# Patient Record
Sex: Male | Born: 2000 | Race: White | Hispanic: No | Marital: Single | Smoking: Never smoker
Health system: Southern US, Community
[De-identification: ages and names within clinical notes are randomized; demographics above are authoritative.]

## PROBLEM LIST (undated history)

## (undated) HISTORY — PX: TYMPANOSTOMY TUBE PLACEMENT: SHX32

## (undated) HISTORY — PX: LASER ABLATION: SHX1947

---
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
## 2020-09-26 ENCOUNTER — Ambulatory Visit: Payer: 59 | Admitting: Medical

## 2020-09-26 ENCOUNTER — Encounter: Payer: Self-pay | Admitting: Medical

## 2020-09-26 ENCOUNTER — Other Ambulatory Visit: Payer: Self-pay

## 2020-09-26 VITALS — BP 120/80 | HR 84 | Ht 76.0 in | Wt 296.6 lb

## 2020-09-26 DIAGNOSIS — L03031 Cellulitis of right toe: Secondary | ICD-10-CM

## 2020-09-26 MED ORDER — AMOXICILLIN-POT CLAVULANATE 875-125 MG PO TABS: 1.0000 | ORAL_TABLET | Freq: Two times a day (BID) | ORAL | 0 refills | Status: DC

## 2020-09-26 NOTE — Progress Notes (Signed)
Subjective:  Kenneth Kennedy is a 20 y.o. male who presents for Chief Complaint  Patient presents with  . Ingrown Toenail    Right foot middle toe   . New Patient (Initial Visit)     Here as a new patient.   His mother sees me for care as well.    In usual state of health until over past week has had swelling redness and pain of right 3rd toe.  No injury.  He cuts toenails straight across.  He started seeing swelling, getting redness pain and some pus drainage.  His mother took a clean nail care probe and stuck into area that was red and angry and pus came out.  He does note now improving area of redness but still has symptoms.  No other aggravating or relieving factors. No other complaint.  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  Sophomore at [employer], history and education major.  No other aggravating or relieving factors.    No other c/o.  The following portions of the patient's history were reviewed and updated as appropriate: allergies, current medications, past family history, past medical history, past social history, past surgical history and problem list.  ROS Otherwise as in subjective above  Objective: BP 120/80   Pulse 84   Ht 6\' 4"  (1.93 m)   Wt 296 lb 9.6 oz (134.5 kg)   SpO2 94%   BMI 36.10 kg/m   General appearance: alert, no distress, well developed, well nourished Pedal pulses normal bilat right 3rd toe medial nailbed with erythema swelling, but no drainage, no obvious pus pocket.  Nail doesn't seem to be ingrowing.   Otherwise toes and toenails unremarkable   Assessment: Encounter Diagnosis  Name Primary?  . Paronychia of third toe, right Yes     Plan: Discussed findings, symptoms.   He notes quite a bit of improvement since few days ago.  Begin medicaiton below, continue soap water bath soaks, good hygiene.  If not resolved within the next week or if worse then recheck   otherwise f/u soon for physical  Kenneth Kennedy was seen today for  ingrown toenail and new patient (initial visit).  Diagnoses and all orders for this visit:  Paronychia of third toe, right  Other orders -     amoxicillin-clavulanate (AUGMENTIN) 875-125 MG tablet; Take 1 tablet by mouth 2 (two) times daily.    Follow up: prn, in next few months for a physical

## 2020-09-26 NOTE — Patient Instructions (Signed)
Paronychia Paronychia is an infection of the skin. It happens near a fingernail or toenail. It may cause pain and swelling around the nail. In some cases, a fluid-filled bump (abscess) can form near or under the nail. Usually, this condition is not serious, and it clears up with treatment. Follow these instructions at home: Wound care  Keep the affected area clean.  Soak the fingers or toes in warm water as told by your doctor. You may be told to do this for 20 minutes, 2-3 times a day.  Keep the area dry when you are not soaking it.  Do not try to drain a fluid-filled bump on your own.  Follow instructions from your doctor about how to take care of the affected area. Make sure you: ? Wash your hands with soap and water before you change your bandage (dressing). If you cannot use soap and water, use hand sanitizer. ? Change your bandage as told by your doctor.  If you had a fluid-filled bump and your doctor drained it, check the area every day for signs of infection. Check for: ? Redness, swelling, or pain. ? Fluid or blood. ? Warmth. ? Pus or a bad smell. Medicines  Take over-the-counter and prescription medicines only as told by your doctor.  If you were prescribed an antibiotic medicine, take it as told by your doctor. Do not stop taking it even if you start to feel better.   General instructions  Avoid touching any chemicals.  Do not pick at the affected area. Prevention  To prevent this condition from happening again: ? Wear rubber gloves when putting your hands in water for washing dishes or other tasks. ? Wear gloves if your hands might touch cleaners or chemicals. ? Avoid injuring your nails or fingertips. ? Do not bite your nails or tear hangnails. ? Do not cut your nails very short. ? Do not cut the skin at the base and sides of the nail (cuticles). ? Use clean nail clippers or scissors when trimming nails. Contact a doctor if:  You feel worse.  You do not get  better.  You have more fluid, blood, or pus coming from the affected area.  Your finger or knuckle is swollen or is hard to move. Get help right away if you have:  A fever or chills.  Redness spreading from the affected area.  Pain in a joint or muscle. Summary  Paronychia is an infection of the skin. It happens near a fingernail or toenail.  This condition may cause pain and swelling around the nail.  Soak the fingers or toes in warm water as told by your doctor.  Usually, this condition is not serious, and it clears up with treatment. This information is not intended to replace advice given to you by your health care provider. Make sure you discuss any questions you have with your health care provider. Document Revised: 06/04/2020 Document Reviewed: 06/04/2020 Elsevier Patient Education  2021 Elsevier Inc.  

## 2020-11-19 ENCOUNTER — Ambulatory Visit: Payer: 59 | Admitting: Family Medicine

## 2020-11-19 ENCOUNTER — Encounter: Payer: Self-pay | Admitting: Family Medicine

## 2020-11-19 ENCOUNTER — Other Ambulatory Visit: Payer: Self-pay

## 2020-11-19 VITALS — BP 120/70 | HR 92 | Temp 98.3°F | Wt 301.0 lb

## 2020-11-19 DIAGNOSIS — L259 Unspecified contact dermatitis, unspecified cause: Secondary | ICD-10-CM | POA: Diagnosis not present

## 2020-11-19 MED ORDER — TRIAMCINOLONE ACETONIDE 0.1 % EX CREA: 1.0000 "application " | TOPICAL_CREAM | Freq: Two times a day (BID) | CUTANEOUS | 0 refills | Status: DC

## 2020-11-19 NOTE — Patient Instructions (Signed)
Use the steroid cream I prescribed twice daily for the next 7 to 10 days.  If the area is getting worse, please let me know.  You can use an ice pack to help reduce the itching. If needed, you can take a Benadryl at bedtime for itching as well.    Contact Dermatitis Dermatitis is redness, soreness, and swelling (inflammation) of the skin. Contact dermatitis is a reaction to something that touches the skin. There are two types of contact dermatitis:  Irritant contact dermatitis. This happens when something bothers (irritates) your skin, like soap.  Allergic contact dermatitis. This is caused when you are exposed to something that you are allergic to, such as poison ivy. What are the causes?  Common causes of irritant contact dermatitis include: ? Makeup. ? Soaps. ? Detergents. ? Bleaches. ? Acids. ? Metals, such as nickel.  Common causes of allergic contact dermatitis include: ? Plants. ? Chemicals. ? Jewelry. ? Latex. ? Medicines. ? Preservatives in products, such as clothing. What increases the risk?  Having a job that exposes you to things that bother your skin.  Having asthma or eczema. What are the signs or symptoms? Symptoms may happen anywhere the irritant has touched your skin. Symptoms include:  Dry or flaky skin.  Redness.  Cracks.  Itching.  Pain or a burning feeling.  Blisters.  Blood or clear fluid draining from skin cracks. With allergic contact dermatitis, swelling may occur. This may happen in places such as the eyelids, mouth, or genitals.   How is this treated?  This condition is treated by checking for the cause of the reaction and protecting your skin. Treatment may also include: ? Steroid creams, ointments, or medicines. ? Antibiotic medicines or other ointments, if you have a skin infection. ? Lotion or medicines to help with itching. ? A bandage (dressing). Follow these instructions at home: Skin care  Moisturize your skin as  needed.  Put cool cloths on your skin.  Put a baking soda paste on your skin. Stir water into baking soda until it looks like a paste.  Do not scratch your skin.  Avoid having things rub up against your skin.  Avoid the use of soaps, perfumes, and dyes. Medicines  Take or apply over-the-counter and prescription medicines only as told by your doctor.  If you were prescribed an antibiotic medicine, take or apply it as told by your doctor. Do not stop using it even if your condition starts to get better. Bathing  Take a bath with: ? Epsom salts. ? Baking soda. ? Colloidal oatmeal.  Bathe less often.  Bathe in warm water. Avoid using hot water. Bandage care  If you were given a bandage, change it as told by your health care provider.  Wash your hands with soap and water before and after you change your bandage. If soap and water are not available, use hand sanitizer. General instructions  Avoid the things that caused your reaction. If you do not know what caused it, keep a journal. Write down: ? What you eat. ? What skin products you use. ? What you drink. ? What you wear in the area that has symptoms. This includes jewelry.  Check the affected areas every day for signs of infection. Check for: ? More redness, swelling, or pain. ? More fluid or blood. ? Warmth. ? Pus or a bad smell.  Keep all follow-up visits as told by your doctor. This is important. Contact a doctor if:  You do not  get better with treatment.  Your condition gets worse.  You have signs of infection, such as: ? More swelling. ? Tenderness. ? More redness. ? Soreness. ? Warmth.  You have a fever.  You have new symptoms. Get help right away if:  You have a very bad headache.  You have neck pain.  Your neck is stiff.  You throw up (vomit).  You feel very sleepy.  You see red streaks coming from the area.  Your bone or joint near the area hurts after the skin has healed.  The area  turns darker.  You have trouble breathing. Summary  Dermatitis is redness, soreness, and swelling of the skin.  Symptoms may occur where the irritant has touched you.  Treatment may include medicines and skin care.  If you do not know what caused your reaction, keep a journal.  Contact a doctor if your condition gets worse or you have signs of infection. This information is not intended to replace advice given to you by your health care provider. Make sure you discuss any questions you have with your health care provider. Document Revised: 11/29/2018 Document Reviewed: 02/22/2018 Elsevier Patient Education  2021 [employer].

## 2020-11-19 NOTE — Progress Notes (Signed)
   Subjective:    Patient ID: Kenneth Kennedy, male    DOB: 08-25-2000, 20 y.o.   MRN: [id number]  HPI Chief Complaint  Patient presents with  . Rash    Rash on both legs, noticed it Saturday or Sunday after lifting a door at home   Complains of a 3 to 4-day history of a pruritic rash on his lower legs, worse on the left.  Rash started a couple days after he and his father working in the basement and lifting a door.  He was wearing shorts and thought he may have come in contact with fiberglass or some sort of irritant. He has been using a cream that his mother gave him.  He does not know what his using.  No other complaints or concerns. Denies fever, chills, nausea, vomiting.   Review of Systems Pertinent positives and negatives in the history of present illness.     Objective:   Physical Exam BP 120/70   Pulse 92   Temp 98.3 F (36.8 C)   Wt (!) 301 lb (136.5 kg)   BMI 36.64 kg/m   Alert and oriented in no acute distress. Red, raised, pruritic rash to the left anterolateral lower leg above the sock line.  He also has a small area of the same on the right anterior, medial Lower leg just above the sock line.  No surrounding erythema, drainage or sign of infection.  Normal-appearing left foot.      Assessment & Plan:  Contact dermatitis, unspecified contact dermatitis type, unspecified trigger - Plan: triamcinolone (KENALOG) 0.1 %  He will use the topical steroid and avoid scratching the area.  Discussed using ice pack/cool compresses to help control itching.  You may also use Benadryl at bedtime if needed.  Follow-up if the area is worsening or not significantly improved in the next 7 to 10 days.  He is aware that he will need to stop the topical steroid in 1 week.

## 2021-04-02 ENCOUNTER — Encounter: Payer: 59 | Admitting: Medical

## 2021-06-12 ENCOUNTER — Other Ambulatory Visit: Payer: Self-pay

## 2021-06-12 ENCOUNTER — Telehealth: Payer: 59 | Admitting: Medical

## 2021-06-12 ENCOUNTER — Encounter: Payer: Self-pay | Admitting: Medical

## 2021-06-12 ENCOUNTER — Other Ambulatory Visit (INDEPENDENT_AMBULATORY_CARE_PROVIDER_SITE_OTHER): Payer: 59

## 2021-06-12 VITALS — HR 93 | Wt 285.0 lb

## 2021-06-12 DIAGNOSIS — J029 Acute pharyngitis, unspecified: Secondary | ICD-10-CM | POA: Diagnosis not present

## 2021-06-12 LAB — POCT RAPID STREP A (OFFICE): Rapid Strep A Screen: NEGATIVE

## 2021-06-12 NOTE — Progress Notes (Signed)
  Subjective:     Patient ID: Kenneth Kennedy, male   DOB: Aug 13, 2001, 20 y.o.   MRN: [id number]  This visit type was conducted due to national recommendations for restrictions regarding the COVID-19 Pandemic (e.g. social distancing) in an effort to limit this patient's exposure and mitigate transmission in our community.  Due to their co-morbid illnesses, this patient is at least at moderate risk for complications without adequate follow up.  This format is felt to be most appropriate for this patient at this time.    Documentation for virtual audio and video telecommunications through [place] encounter:  The patient was located at home. The provider was located in the office. The patient did consent to this visit and is aware of possible charges through their insurance for this visit.  The other persons participating in this telemedicine service were none. Time spent on call was 20 minutes and in review of previous records 20 minutes total.  This virtual service is not related to other E/M service within previous 7 days.   HPI Chief Complaint  Patient presents with   Sore Throat    Sore throat started on Wednesday night Thursday morning. Headache started yesterday. No other symptoms   Virtual consult for illness.  Started a day and a half ago with headache, sore throat, subjective fever, possible white spots on the tonsils and swollen tonsils.  Occasional cough but not much cough.  No head congestion just feels a little congestion in the throat.  No chest symptoms.  No body aches but has been cold.  But it has been cold outside anyhow.  No vomiting, no diarrhea.  Has had some nausea.  No sick contacts.  Otherwise normal state of health.  No other aggravating or relieving factors. No other complaint.  No past medical history on file. No current outpatient medications on file prior to visit.   No current facility-administered medications on file prior to visit.    Review of  Systems As in subjective    Objective:   Physical Exam Due to coronavirus pandemic stay at home measures, patient visit was virtual and they were not examined in person.   Pulse 93   Wt 285 lb (129.3 kg)   BMI 34.69 kg/m   Well-developed, well-nourished, no acute distress Mildly ill-appearing No obvious wheezing or labored breathing     Assessment:     Encounter Diagnosis  Name Primary?   Sore throat Yes       Plan:     We discussed symptoms and concerns.  Likely viral sore throat versus strep.  We discussed supportive care rest, hydration, salt water gargles, warm fluids, use of ibuprofen to help with throat pain fever or body aches.    If positive for strep we will treat with antibiotic.  We discussed the difference between respiratory viral and strep infection.  Over the weekend is much worse or other new symptoms then recheck a call back or get reevaluated  Kenneth Kennedy was seen today for sore throat.  Diagnoses and all orders for this visit:  Sore throat  Follow-up in our back parking lot for strep testing this morning

## 2021-08-12 ENCOUNTER — Other Ambulatory Visit: Payer: Self-pay

## 2021-08-12 ENCOUNTER — Encounter: Payer: Self-pay | Admitting: Medical

## 2021-08-12 ENCOUNTER — Ambulatory Visit: Payer: 59 | Admitting: Medical

## 2021-08-12 VITALS — BP 112/82 | HR 90 | Ht 76.0 in | Wt 277.0 lb

## 2021-08-12 DIAGNOSIS — Z13220 Encounter for screening for lipoid disorders: Secondary | ICD-10-CM | POA: Diagnosis not present

## 2021-08-12 DIAGNOSIS — Z131 Encounter for screening for diabetes mellitus: Secondary | ICD-10-CM

## 2021-08-12 DIAGNOSIS — Z0000 Encounter for general adult medical examination without abnormal findings: Secondary | ICD-10-CM | POA: Diagnosis not present

## 2021-08-12 DIAGNOSIS — Z2821 Immunization not carried out because of patient refusal: Secondary | ICD-10-CM | POA: Insufficient documentation

## 2021-08-12 DIAGNOSIS — Z6833 Body mass index (BMI) 33.0-33.9, adult: Secondary | ICD-10-CM | POA: Insufficient documentation

## 2021-08-12 DIAGNOSIS — Z7185 Encounter for immunization safety counseling: Secondary | ICD-10-CM | POA: Insufficient documentation

## 2021-08-12 DIAGNOSIS — Z1329 Encounter for screening for other suspected endocrine disorder: Secondary | ICD-10-CM

## 2021-08-12 NOTE — Progress Notes (Signed)
Subjective:   HPI  Kenneth Kennedy is a 20 y.o. male who presents for Chief Complaint  Patient presents with   Annual Exam    No concerns, currently in [place] for [employer], declines flu shot and covid booster today    Patient Care Team: Kynsleigh Westendorf, Leward Quan as PCP - General (Family Medicine) Sees dentist No recent eye doctor   Concerns: none   Reviewed their medical, surgical, family, social, medication, and allergy history and updated chart as appropriate.  History reviewed. No pertinent past medical history.  Past Surgical History:  Procedure Laterality Date   TYMPANOSTOMY TUBE PLACEMENT      Family History  Problem Relation Age of Onset   Depression Mother    Anxiety disorder Mother    Cancer Father        melanoma   Hydrocephalus Brother    Diabetes Other    Stroke Other    Heart disease Other     No current outpatient medications on file.  Allergies  Allergen Reactions   Cleocin [Clindamycin] Hives and Nausea And Vomiting     Review of Systems Constitutional: -fever, -chills, -sweats, -unexpected weight change, -decreased appetite, -fatigue Allergy: -sneezing, -itching, -congestion Dermatology: -changing moles, --rash, -lumps ENT: -runny nose, -ear pain, -sore throat, -hoarseness, -sinus pain, -teeth pain, - ringing in ears, -hearing loss, -nosebleeds Cardiology: -chest pain, -palpitations, -swelling, -difficulty breathing when lying flat, -waking up short of breath Respiratory: -cough, -shortness of breath, -difficulty breathing with exercise or exertion, -wheezing, -coughing up blood Gastroenterology: -abdominal pain, -nausea, -vomiting, -diarrhea, -constipation, -blood in stool, -changes in bowel movement, -difficulty swallowing or eating Hematology: -bleeding, -bruising  Musculoskeletal: -joint aches, -muscle aches, -joint swelling, -back pain, -neck pain, -cramping, -changes in gait Ophthalmology: denies vision changes, eye redness, itching,  discharge Urology: -burning with urination, -difficulty urinating, -blood in urine, -urinary frequency, -urgency, -incontinence Neurology: -headache, -weakness, -tingling, -numbness, -memory loss, -falls, -dizziness Psychology: -depressed mood, -agitation, -sleep problems Male GU: no testicular mass, pain, no lymph nodes swollen, no swelling, no rash.  Depression screen [facility] 2/9 08/12/2021 06/12/2021 09/26/2020  Decreased Interest 0 0 0  Down, Depressed, Hopeless 0 0 0  PHQ - 2 Score 0 0 0        Objective:  BP 112/82 (BP Location: Right Arm, Patient Position: Sitting)    Pulse 90    Ht $R'6\' 4"'HH$  (1.93 m)    Wt 277 lb (125.6 kg)    SpO2 97%    BMI 33.72 kg/m   Wt Readings from Last 3 Encounters:  08/12/21 277 lb (125.6 kg)  06/12/21 285 lb (129.3 kg)  11/19/20 (!) 301 lb (136.5 kg) (>99 %, Z= 3.08)*   * Growth percentiles are based on CDC (Boys, 2-20 Years) data.   BP Readings from Last 3 Encounters:  08/12/21 112/82  11/19/20 120/70  09/26/20 120/80    General appearance: alert, no distress, WD/WN, Caucasian male Skin: scattered macules, no worrisome lesions HEENT: normocephalic, conjunctiva/corneas normal, sclerae anicteric, PERRLA, EOMi Neck: supple, no lymphadenopathy, no thyromegaly, no masses, normal ROM, no bruits Chest: non tender, normal shape and expansion Heart: RRR, normal S1, S2, no murmurs Lungs: CTA bilaterally, no wheezes, rhonchi, or rales Abdomen: +bs, soft, non tender, non distended, no masses, no hepatomegaly, no splenomegaly, no bruits Back: non tender, normal ROM, no scoliosis Musculoskeletal: upper extremities non tender, no obvious deformity, normal ROM throughout, lower extremities non tender, no obvious deformity, normal ROM throughout Extremities: no edema, no cyanosis, no clubbing Pulses:  2+ symmetric, upper and lower extremities, normal cap refill Neurological: alert, oriented x 3, CN2-12 intact, strength normal upper extremities and lower extremities,  sensation normal throughout, DTRs 2+ throughout, no cerebellar signs, gait normal Psychiatric: normal affect, behavior normal, pleasant  GU: normal male external genitalia,circumcised, nontender, no masses, no hernia, no lymphadenopathy Rectal: deferred   Assessment and Plan :   Encounter Diagnoses  Name Primary?   Encounter for health maintenance examination in adult Yes   Vaccine counseling    Influenza vaccination declined    Screening for lipid disorders    Screening for diabetes mellitus    Screening for thyroid disorder    BMI 33.0-33.9,adult     This visit was a preventative care visit, also known as wellness visit or routine physical.   Topics typically include healthy lifestyle, diet, exercise, preventative care, vaccinations, sick and well care, proper use of emergency dept and after hours care, as well as other concerns.     Recommendations: Continue to return yearly for your annual wellness and preventative care visits.  This gives [place] a chance to discuss healthy lifestyle, exercise, vaccinations, review your chart record, and perform screenings where appropriate.  I recommend you see your dentist yearly for routine dental care including hygiene visits twice yearly.   Vaccination recommendations were reviewed Immunization History  Administered Date(s) Administered   DTaP 03/20/2001, 05/24/2001, 08/09/2001, 04/16/2002, 02/03/2005   HPV 9-valent 02/05/2015, 03/03/2016   Hepatitis A 02/03/2005, 08/06/2005   Hepatitis B 07/15/2001, 02/17/2001, 11/08/2001   HiB (PRP-OMP) 03/20/2001, 05/24/2001, 08/09/2001, 04/16/2002   IPV 03/20/2001, 05/24/2001, 11/08/2001, 02/03/2005   MMR 01/15/2002, 02/03/2005   Meningococcal B, OMV 02/15/2017, 05/09/2017   Meningococcal Conjugate 06/06/2013, 02/15/2017   PFIZER(Purple Top)SARS-COV-2 Vaccination 11/03/2019, 11/28/2019   Pneumococcal Conjugate-13 03/20/2001, 05/24/2001, 08/09/2001, 01/15/2002   Tdap 06/21/2011, 07/17/2018    Varicella 01/15/2002, 04/15/2006    He declines flu shot   Screening for cancer: Colon cancer screening: Age 15  Testicular cancer screening You should do a monthly self testicular exam if you are between 20-65 years old  We discussed PSA, prostate exam, and prostate cancer screening risks/benefits.   Age 63  Skin cancer screening: Check your skin regularly for new changes, growing lesions, or other lesions of concern Come in for evaluation if you have skin lesions of concern.  Lung cancer screening: If you have a greater than 20 pack year history of tobacco use, then you may qualify for lung cancer screening with a chest CT scan.   Please call your insurance company to inquire about coverage for this test.  We currently don't have screenings for other cancers besides breast, cervical, colon, and lung cancers.  If you have a strong family history of cancer or have other cancer screening concerns, please let me know.     Heart health: Get at least 150 minutes of aerobic exercise weekly Limit alcohol It is important to maintain a healthy blood pressure and healthy cholesterol numbers  Heart disease screening: Screening for heart disease includes screening for blood pressure, fasting lipids, glucose/diabetes screening, BMI height to weight ratio, reviewed of smoking status, physical activity, and diet.    Goals include blood pressure 120/80 or less, maintaining a healthy lipid/cholesterol profile, preventing diabetes or keeping diabetes numbers under good control, not smoking or using tobacco products, exercising most days per week or at least 150 minutes per week of exercise, and eating healthy variety of fruits and vegetables, healthy oils, and avoiding unhealthy food choices like fried food, fast  food, high sugar and high cholesterol foods.       Medical care options: I recommend you continue to seek care here first for routine care.  We try really hard to have available  appointments Monday through Friday daytime hours for sick visits, acute visits, and physicals.  Urgent care should be used for after hours and weekends for significant issues that cannot wait till the next day.  The emergency department should be used for significant potentially life-threatening emergencies.  The emergency department is expensive, can often have long wait times for less significant concerns, so try to utilize primary care, urgent care, or telemedicine when possible to avoid unnecessary trips to the emergency department.  Virtual visits and telemedicine have been introduced since the pandemic started in 2020, and can be convenient ways to receive medical care.  We offer virtual appointments as well to assist you in a variety of options to seek medical care.    Separate significant issues discussed: Continue efforts to lose weight, congratulated him on his efforts this year   Melvyn was seen today for annual exam.  Diagnoses and all orders for this visit:  Encounter for health maintenance examination in adult -     Comprehensive metabolic panel -     CBC -     Lipid panel -     TSH -     Hemoglobin A1c  Vaccine counseling  Influenza vaccination declined  Screening for lipid disorders -     Lipid panel  Screening for diabetes mellitus -     Hemoglobin A1c  Screening for thyroid disorder -     TSH  BMI 33.0-33.9,adult   Follow-up pending labs, yearly for physical

## 2021-08-13 ENCOUNTER — Other Ambulatory Visit: Payer: Self-pay | Admitting: Medical

## 2021-08-13 DIAGNOSIS — R748 Abnormal levels of other serum enzymes: Secondary | ICD-10-CM

## 2021-08-13 LAB — LIPID PANEL
Chol/HDL Ratio: 4.8 ratio (ref 0.0–5.0)
Cholesterol, Total: 171 mg/dL (ref 100–199)
HDL: 36 mg/dL — ABNORMAL LOW (ref >39)
LDL Chol Calc (NIH): 119 mg/dL — ABNORMAL HIGH (ref 0–99)
Triglycerides: 84 mg/dL (ref 0–149)
VLDL Cholesterol Cal: 16 mg/dL (ref 5–40)

## 2021-08-13 LAB — CBC
Hematocrit: 48.2 % (ref 37.5–51.0)
Hemoglobin: 17 g/dL (ref 13.0–17.7)
MCH: 30.4 pg (ref 26.6–33.0)
MCHC: 35.3 g/dL (ref 31.5–35.7)
MCV: 86 fL (ref 79–97)
Platelets: 326 10*3/uL (ref 150–450)
RBC: 5.6 x10E6/uL (ref 4.14–5.80)
RDW: 12.7 % (ref 11.6–15.4)
WBC: 6.5 10*3/uL (ref 3.4–10.8)

## 2021-08-13 LAB — COMPREHENSIVE METABOLIC PANEL
ALT: 37 IU/L (ref 0–44)
AST: 19 IU/L (ref 0–40)
Albumin/Globulin Ratio: 1.8 (ref 1.2–2.2)
Albumin: 4.9 g/dL (ref 4.1–5.2)
Alkaline Phosphatase: 174 IU/L — ABNORMAL HIGH (ref 51–125)
BUN/Creatinine Ratio: 9 (ref 9–20)
BUN: 10 mg/dL (ref 6–20)
Bilirubin Total: 0.8 mg/dL (ref 0.0–1.2)
CO2: 23 mmol/L (ref 20–29)
Calcium: 9.8 mg/dL (ref 8.7–10.2)
Chloride: 102 mmol/L (ref 96–106)
Creatinine, Ser: 1.14 mg/dL (ref 0.76–1.27)
Globulin, Total: 2.7 g/dL (ref 1.5–4.5)
Glucose: 97 mg/dL (ref 70–99)
Potassium: 4.9 mmol/L (ref 3.5–5.2)
Sodium: 139 mmol/L (ref 134–144)
Total Protein: 7.6 g/dL (ref 6.0–8.5)
eGFR: 94 mL/min/{1.73_m2} (ref >59)

## 2021-08-13 LAB — HEMOGLOBIN A1C
Est. average glucose Bld gHb Est-mCnc: 105 mg/dL
Hgb A1c MFr Bld: 5.3 % (ref 4.8–5.6)

## 2021-08-13 LAB — TSH: TSH: 0.55 u[IU]/mL (ref 0.450–4.500)

## 2021-08-16 ENCOUNTER — Emergency Department (HOSPITAL_COMMUNITY)
Admission: EM | Admit: 2021-08-16 | Discharge: 2021-08-16 | Disposition: A | Discharge status: Home / Self Care | Payer: 59 | Attending: Emergency Medicine | Admitting: Emergency Medicine

## 2021-08-16 ENCOUNTER — Encounter (HOSPITAL_COMMUNITY): Payer: Self-pay | Admitting: Emergency Medicine

## 2021-08-16 ENCOUNTER — Other Ambulatory Visit: Payer: Self-pay

## 2021-08-16 DIAGNOSIS — U071 COVID-19: Secondary | ICD-10-CM | POA: Insufficient documentation

## 2021-08-16 DIAGNOSIS — R000 Tachycardia, unspecified: Secondary | ICD-10-CM | POA: Insufficient documentation

## 2021-08-16 DIAGNOSIS — J069 Acute upper respiratory infection, unspecified: Secondary | ICD-10-CM | POA: Diagnosis not present

## 2021-08-16 DIAGNOSIS — R059 Cough, unspecified: Secondary | ICD-10-CM | POA: Diagnosis present

## 2021-08-16 LAB — RESP PANEL BY RT-PCR (FLU A&B, COVID) ARPGX2
Influenza A by PCR: NEGATIVE
Influenza B by PCR: NEGATIVE
SARS Coronavirus 2 by RT PCR: POSITIVE — AB

## 2021-08-16 NOTE — ED Triage Notes (Addendum)
Complains of flu like symptoms and abdominal pain. Cough, fever, HA x2 days.

## 2021-08-16 NOTE — ED Provider Notes (Signed)
[facility]-EMERGENCY DEPT Provider Note   CSN: [id number] Arrival date & time: 08/16/21  1509     History No chief complaint on file.   Kenneth Kennedy is a 20 y.o. male with no significant past medical history who presents with 2 days of cough, fever, headache, sore throat, as well as diarrhea.  Patient denies any chest pain, shortness of breath, nausea, vomiting.  Patient denies any hematochezia.  Patient reports that he had a fever of 101.5 earlier today, which has improved to 99.5 after oral Motrin x1.  Patient reports that he has had no recent sick contacts, did not have a flu vaccine, or COVID booster this year.  Patient denies history of asthma, COPD, tobacco use.  Patient reports that his headache is frontally located, worse with cough, gradual in onset, not associated with blurry vision, weakness, numbness.  HPI     History reviewed. No pertinent past medical history.  Patient Active Problem List   Diagnosis Date Noted   Encounter for health maintenance examination in adult 08/12/2021   Vaccine counseling 08/12/2021   Influenza vaccination declined 08/12/2021   BMI 33.0-33.9,adult 08/12/2021    Past Surgical History:  Procedure Laterality Date   TYMPANOSTOMY TUBE PLACEMENT         Family History  Problem Relation Age of Onset   Depression Mother    Anxiety disorder Mother    Cancer Father        melanoma   Hydrocephalus Brother    Diabetes Other    Stroke Other    Heart disease Other     Social History   Tobacco Use   Smoking status: Never   Smokeless tobacco: Never  Vaping Use   Vaping Use: Never used  Substance Use Topics   Alcohol use: No   Drug use: No    Home Medications Prior to Admission medications   Not on File    Allergies    Cleocin [clindamycin]  Review of Systems   Review of Systems  Constitutional:  Positive for chills and fever.  HENT:  Positive for sore throat.   Respiratory:  Positive for cough.    Gastrointestinal:  Positive for diarrhea.  All other systems reviewed and are negative.  Physical Exam Updated Vital Signs BP (!) 144/88 (BP Location: Right Arm)    Pulse (!) 110    Temp 99.5 F (37.5 C) (Oral)    Resp 18    SpO2 100%   Physical Exam Vitals and nursing note reviewed.  Constitutional:      General: He is not in acute distress.    Appearance: Normal appearance.  HENT:     Head: Normocephalic and atraumatic.     Nose: Congestion present.     Mouth/Throat:     Comments: He does have erythema, and 1+ tonsils bilaterally.  He does not have any evidence of peritonsillar abscess, uvula is midline.  There is no evidence of tonsillar exudate.  Airway is patent, intact swallow reflex. Eyes:     General:        Right eye: No discharge.        Left eye: No discharge.  Neck:     Comments: Patient does have some palpable cervical adenopathy on the right. Cardiovascular:     Rate and Rhythm: Regular rhythm. Tachycardia present.     Heart sounds: No murmur heard.   No friction rub. No gallop.  Pulmonary:     Effort: Pulmonary effort is normal.  Breath sounds: Normal breath sounds.     Comments: Clear breath sounds bilaterally, no respiratory distress, good excursion bilaterally. Abdominal:     General: Bowel sounds are normal.     Palpations: Abdomen is soft.  Musculoskeletal:     Cervical back: Neck supple. No rigidity.  Lymphadenopathy:     Cervical: Cervical adenopathy present.  Skin:    General: Skin is warm and dry.     Capillary Refill: Capillary refill takes less than 2 seconds.  Neurological:     Mental Status: He is alert and oriented to person, place, and time.  Psychiatric:        Mood and Affect: Mood normal.        Behavior: Behavior normal.    ED Results / Procedures / Treatments   Labs (all labs ordered are listed, but only abnormal results are displayed) Labs Reviewed  RESP PANEL BY RT-PCR (FLU A&B, COVID) [id number]     EKG None  Radiology No results found.  Procedures Procedures   Medications Ordered in ED Medications - No data to display  ED Course  I have reviewed the triage vital signs and the nursing notes.  Pertinent labs & imaging results that were available during my care of the patient were reviewed by me and considered in my medical decision making (see chart for details).    MDM Rules/Calculators/A&P                         Overall well-appearing male with signs and symptoms of a simple upper respiratory infection.  Patient does have some erythema in his posterior oropharynx, however his tonsils are 1+ bilaterally without exudate, uvula midline, no evidence of peritonsillar abscess.  Patient's airway is patent, he is having no respiratory distress, no stridor.  Patient does have a borderline fever, as well as tachycardia on arrival.  I do believe that this is secondary to his upper respiratory infection syndrome.  High clinical suspicion for flu.  As patient has no chest pain, clear breath sounds bilaterally, and no medical conditions that would make him high risk for flu versus COVID, we discussed I recommend respiratory virus panel, and discharge.  No red flag symptoms for headache including neck rigidity, focal neurologic deficit, confusion, or mental status change. Encourage supportive care with over-the-counter cold and flu medications, fluids, Tylenol, ibuprofen for body aches, headaches, fever.  Return precautions given, patient discharged in stable condition. Final Clinical Impression(s) / ED Diagnoses Final diagnoses:  Viral upper respiratory tract infection    Rx / DC Orders ED Discharge Orders     None        [place] 08/16/21 1531    Gloris Manchester, MD 08/16/21 2325

## 2021-08-16 NOTE — Discharge Instructions (Signed)
As we discussed you have signs and symptoms that are consistent with an upper respiratory infection of viral origin.  Based on my evaluation of your throat I have minimal concern that you have strep throat.  You can check the results of your COVID, flu swab on your patient portal in around 2 hours.  If you do have COVID current CDC guidelines recommend 5 days of strict quarantine with 5 days of strict masking thereafter.  If you have the flu we recommend that you stay out of work or school until you are 24 hours fever free without the use of ibuprofen or Tylenol.  I recommend that you use symptomatic over-the-counter relief including Mucinex or other decongestants for stuffy nose, sneezing, you can use Robitussin to help with your sore throat, or lozenges.  I recommend that you drink plenty of fluids, rest.  Please continue to take ibuprofen, Tylenol for fever, body aches, headache.  If your symptoms significantly worsen despite use of the medications above, and especially if you develop chest pain, shortness of breath, begin to have blood in your stool or intractable nausea and vomiting I recommend that you return for further evaluation.  It was a pleasure taking care of you today, I hope that you have a Merry Christmas.

## 2021-08-18 ENCOUNTER — Other Ambulatory Visit: Payer: Self-pay

## 2021-08-18 ENCOUNTER — Encounter: Payer: Self-pay | Admitting: Medical

## 2021-08-18 ENCOUNTER — Telehealth (INDEPENDENT_AMBULATORY_CARE_PROVIDER_SITE_OTHER): Payer: 59 | Admitting: Medical

## 2021-08-18 VITALS — HR 96 | Temp 99.2°F | Wt 270.0 lb

## 2021-08-18 DIAGNOSIS — U071 COVID-19: Secondary | ICD-10-CM | POA: Diagnosis not present

## 2021-08-18 DIAGNOSIS — J039 Acute tonsillitis, unspecified: Secondary | ICD-10-CM

## 2021-08-18 MED ORDER — AMOXICILLIN 500 MG PO CAPS: 500.0000 mg | ORAL_CAPSULE | Freq: Three times a day (TID) | ORAL | 0 refills | Status: AC

## 2021-08-18 NOTE — Progress Notes (Signed)
Subjective:     Patient ID: Kenneth Kennedy, male   DOB: 01-25-2001, 20 y.o.   MRN: [id number]  This visit type was conducted due to national recommendations for restrictions regarding the COVID-19 Pandemic (e.g. social distancing) in an effort to limit this patient's exposure and mitigate transmission in our community.  Due to their co-morbid illnesses, this patient is at least at moderate risk for complications without adequate follow up.  This format is felt to be most appropriate for this patient at this time.    Documentation for virtual audio and video telecommunications through [facility] encounter:  The patient was located at home. The provider was located in the office. The patient did consent to this visit and is aware of possible charges through their insurance for this visit.  The other persons participating in this telemedicine service were none. Time spent on call was 20 minutes and in review of previous records 20 minutes total.  This virtual service is not related to other E/M service within previous 7 days.   HPI Chief Complaint  Patient presents with   Covid Positive    DX on 08/16/21 at [facility]- currently only S/S is sore throat with mild congestion   Virtual consult for illness.  On Christmas day he felt terrible and went to the emergency department at [facility].  Was evaluated and tested positive for COVID.  He found this result out the next morning  Symptoms started on December 25.  His main symptoms is swollen tonsils red tonsils and really bad sore throat he notes congestion, some cough, runny nose, headaches, chills, sweats.  No nausea vomiting or diarrhea.  No shortness of breath or wheezing.  No sick contacts.  Using tylenol and alternating mucinex, sudafed, dayquil  No other aggravating or relieving factors. No other complaint.   Review of Systems As in subjective    Objective:   Physical Exam Due to coronavirus pandemic stay at home  measures, patient visit was virtual and they were not examined in person.   Pulse 96    Temp 99.2 F (37.3 C) (Oral)    Wt 270 lb (122.5 kg)    BMI 32.87 kg/m   Gen: wd, wn, nad, somewhat ill appearing Answers questions appropriately No labored breathing On video it appears that he has bilateral swollen erythematous tonsils without exudate    Assessment:     Encounter Diagnoses  Name Primary?   COVID Yes   Tonsillitis        Plan:     We discussed symptoms and concerns.  Discussed supportive care, rest, hydration, salt water gargles.  Advised ibuprofen over-the-counter 3 tablets twice daily for the next few days for throat pain and aches.  Advised to use DayQuil or Sudafed or Mucinex but not simultaneously all of them at the same time.  Begin medication below for likely concomitant strep tonsillitis which we have seen with COVID on numerous occasions.  If not significant improvements in the throat in the next 48 hours then call back.  Otherwise discussed typical timeframe to see symptoms improved from COVID infection.  We discussed that fatigue and cough can linger weeks after the initial infection.  We discussed period of quarantine as well.  [name] was seen today for covid positive.  Diagnoses and all orders for this visit:  COVID  Tonsillitis  Other orders -     amoxicillin (AMOXIL) 500 MG capsule; Take 1 capsule (500 mg total) by mouth 3 (three) times daily  for 10 days.  F/u prn

## 2021-08-23 DIAGNOSIS — Z8659 Personal history of other mental and behavioral disorders: Secondary | ICD-10-CM

## 2021-08-23 HISTORY — DX: Personal history of other mental and behavioral disorders: Z86.59

## 2022-01-06 ENCOUNTER — Ambulatory Visit: Payer: 59 | Admitting: Medical

## 2022-01-06 VITALS — BP 104/68 | HR 90 | Wt 284.6 lb

## 2022-01-06 DIAGNOSIS — F321 Major depressive disorder, single episode, moderate: Secondary | ICD-10-CM | POA: Insufficient documentation

## 2022-01-06 MED ORDER — FLUOXETINE HCL 20 MG PO TABS: 20.0000 mg | ORAL_TABLET | Freq: Every day | ORAL | 1 refills | Status: DC

## 2022-01-06 NOTE — Assessment & Plan Note (Addendum)
Discussed concerns.  Discussed possible coping mechanisms.  Begin counseling next week as planned.   Begin trial of medication, Prozac.  Discussed risks and benefits of medications.  Discussed local mental health and crisis resources.  Follow up in 2-3 weeks.   ?

## 2022-01-06 NOTE — Progress Notes (Signed)
Subjective: ? Kenneth Kennedy is a 21 y.o. male who presents for ?Chief Complaint  ?Patient presents with  ? aniexty and depression  ?  Anxiety and depression getting worse.been going on for years. Never prescribed meds for this. Has an appointment therapist on Monday through insurance company. Pq-9 and GAD abnormal  ?   ?Depression ?       This is a recurrent problem.  The current episode started more than 1 year ago.   The onset quality is gradual.   The problem occurs daily.  The most recent episode lasted 4 years.  ?The problem is unchanged.  Associated symptoms include decreased concentration, decreased interest and sad.  Associated symptoms include no suicidal ideas.     The symptoms are aggravated by nothing.  Past treatments include nothing.  Previous treatment provided no relief relief.  Risk factors include family history of mental illness.  ? ?He notes problems with anxiety and depression for years, but recently worse.   Lately other people have commented on his mood and his school work is suffering. ? 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  Lives at home with parents.    Working part time.  Part time coaching, part time camp counselor.    ? ?He denies any mentor relationship. ? ?Relationship with parents is ok.   Has an older brother.  They only talk when coaching. ? ?No alcohol, no drugs.    ? ?On father's side there is a strong history of depression.  No known history of bipolar or schizophrenia or other mental health issues.   ? ?Having some bad grades currently. ? ?No current relationship ? ?Current coping is staying busy and working. ? ?Plans to pursue aviation ? ?No other aggravating or relieving factors.   ? ?No other c/o. ? ?No past medical history on file. ?No current outpatient medications on file prior to visit.  ? ?No current facility-administered medications on file prior to visit.  ? ? ? ?The following portions of the patient's history were reviewed and  updated as appropriate: allergies, current medications, past family history, past medical history, past social history, past surgical history and problem list. ? ?ROS ?Otherwise as in subjective above ? ? ? ?Objective: ?BP 104/68   Pulse 90   Wt 284 lb 9.6 oz (129.1 kg)   BMI 34.64 kg/m?  ? ?General appearance: alert, no distress, well developed, well nourished ?Psych: pleasant, good eye contact, answers questions appropriately ? ? ?Assessment: ?Encounter Diagnosis  ?Name Primary?  ? Depression, major, single episode, moderate (HCC) Yes  ? ? ? ?Plan: ? ?Problem List Items Addressed This Visit   ? ? Depression, major, single episode, moderate (HCC) - Primary  ?  Discussed concerns.  Discussed possible coping mechanisms.  Begin counseling next week as planned.   Begin trial of medication, Prozac.  Discussed risks and benefits of medications.  Discussed local mental health and crisis resources.  Follow up in 2-3 weeks.   ? ?  ?  ? Relevant Medications  ? FLUoxetine (PROZAC) 20 MG tablet  ? ? ?Follow up: 3-4 weeks ?

## 2022-02-03 ENCOUNTER — Ambulatory Visit: Payer: 59 | Admitting: Medical

## 2022-02-03 VITALS — BP 118/70 | HR 84 | Wt 274.0 lb

## 2022-02-03 DIAGNOSIS — F321 Major depressive disorder, single episode, moderate: Secondary | ICD-10-CM | POA: Diagnosis not present

## 2022-02-03 DIAGNOSIS — R748 Abnormal levels of other serum enzymes: Secondary | ICD-10-CM | POA: Insufficient documentation

## 2022-02-03 MED ORDER — FLUOXETINE HCL 20 MG PO TABS: 30.0000 mg | ORAL_TABLET | Freq: Every day | ORAL | 2 refills | Status: DC

## 2022-02-03 NOTE — Assessment & Plan Note (Signed)
Continue counseling, increase fluoxetine Prozac to 30mg  daily.  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  F/u 3-4 weeks

## 2022-02-03 NOTE — Assessment & Plan Note (Signed)
likely vit D deficinecy or age related . plan to recheck this lab late summer.   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

## 2022-02-03 NOTE — Progress Notes (Signed)
Subjective:  Kenneth Kennedy is a 21 y.o. male who presents for Chief Complaint  Patient presents with   4 week folllow-up on depression     Some better since last visit     Here for recheck on depression.  Last visit began trial of Prozac and started counseling.  Has had 2 sessions with counseling since last visit.   Hasn't seen major improvement but some improvements since starting Prozac.   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  Here to recheck on alkaline phosphatase elevation in low HDL cholesterol from his physical a few months ago in December.  He notes problems with anxiety and depression for years, but recently worse.    Prior to 12/2021 visit, other people had commented on his mood and his school work is suffering.  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  Lives at home with parents.    Working part time.  Part time coaching, part time camp counselor.   On summer break currently  Relationship with parents is ok.   Has an older brother.  They only talk when coaching.  No alcohol, no drugs.     On father's side there is a strong history of depression.  No known history of bipolar or schizophrenia or other mental health issues.    No current relationship  Plans to pursue aviation  No other aggravating or relieving factors.    No other c/o.  No past medical history on file. Current Outpatient Medications on File Prior to Visit  Medication Sig Dispense Refill   Celecoxib (CELEBREX PO) Take by mouth.     No current facility-administered medications on file prior to visit.     The following portions of the patient's history were reviewed and updated as appropriate: allergies, current medications, past family history, past medical history, past social history, past surgical history and problem list.  ROS Otherwise as in subjective above    Objective: BP 118/70   Pulse 84   Wt 274 lb  (124.3 kg)   BMI 33.35 kg/m   General appearance: alert, no distress, well developed, well nourished Psych: pleasant, good eye contact, answers questions appropriately   Assessment: Encounter Diagnoses  Name Primary?   Depression, major, single episode, moderate (HCC) Yes   Alkaline phosphatase elevation      Plan:  Problem List Items Addressed This Visit     Depression, major, single episode, moderate (HCC) - Primary    Continue counseling, increase fluoxetine Prozac to 30mg  daily.  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  F/u 3-4 weeks      Relevant Medications   FLUoxetine (PROZAC) 20 MG tablet   Alkaline phosphatase elevation    likely vit D deficinecy or age related . plan to recheck this lab late summer.   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

## 2022-03-18 ENCOUNTER — Ambulatory Visit: Payer: 59 | Admitting: Medical

## 2022-03-18 ENCOUNTER — Encounter: Payer: Self-pay | Admitting: Medical

## 2022-03-18 VITALS — BP 130/80 | HR 82 | Wt 275.0 lb

## 2022-03-18 DIAGNOSIS — R5383 Other fatigue: Secondary | ICD-10-CM

## 2022-03-18 DIAGNOSIS — R4589 Other symptoms and signs involving emotional state: Secondary | ICD-10-CM

## 2022-03-18 DIAGNOSIS — R6882 Decreased libido: Secondary | ICD-10-CM | POA: Diagnosis not present

## 2022-03-18 DIAGNOSIS — R748 Abnormal levels of other serum enzymes: Secondary | ICD-10-CM

## 2022-03-18 NOTE — Progress Notes (Signed)
Subjective:  Kenneth Kennedy is a 21 y.o. male who presents for Chief Complaint  Patient presents with   Acute Visit    Pt stated that the fluoxetine is making him sleepy and tired. Wants to see if testosterone is ok.     Here for med check.   He has been taking fluoxetine but it is making him sleepy despite using it in the morning or at nighttime.  It is not really working.  He is still seeing counseling.  They are still trying to get down to the bottom of why he feels this way.  It was suggested for him to have his testosterone checked.  He does note low libido.  He notes trouble reaching climax.  1 new partner.  He denies snoring or gasping in his sleep but he does feel sleepy during the day and not necessarily energetic.  He has been dealing with fatigue.  No family history of sleep apnea.  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  Here to recheck on alkaline phosphatase elevation  He notes problems with anxiety and depression for years  In school at [employer], junior, going into senior year.  Lives at home with parents.    Working part time.  Part time coaching, part time camp counselor.   On summer break currently  Relationship with parents is ok.   Has an older brother, but they only talk when coaching.  No alcohol, no drugs.     On father's side there is a strong history of depression.  No known history of bipolar or schizophrenia or other mental health issues.    Plans to pursue aviation  No other aggravating or relieving factors.    No other c/o.  No past medical history on file. Current Outpatient Medications on File Prior to Visit  Medication Sig Dispense Refill   Celecoxib (CELEBREX PO) Take by mouth.     FLUoxetine (PROZAC) 20 MG tablet Take 1.5 tablets (30 mg total) by mouth daily. 45 tablet 2   No current facility-administered medications on file prior to visit.     The following portions of the  patient's history were reviewed and updated as appropriate: allergies, current medications, past family history, past medical history, past social history, past surgical history and problem list.  ROS Otherwise as in subjective above    Objective: BP 130/80   Pulse 82   Wt 275 lb (124.7 kg)   SpO2 99%   BMI 33.47 kg/m   Wt Readings from Last 3 Encounters:  03/18/22 275 lb (124.7 kg)  02/03/22 274 lb (124.3 kg)  01/06/22 284 lb 9.6 oz (129.1 kg)    General appearance: alert, no distress, well developed, well nourished Psych: pleasant, good eye contact, answers questions appropriately HENT normal, no enlarged tonsils    Assessment: Encounter Diagnoses  Name Primary?   Depressed mood Yes   Low libido    Other fatigue    Elevated alkaline phosphatase level      Plan: Depressed mood - stop fluoxetine given sedation and no major improvmeent.   Labs today, continue counseling . We will consider other options for therapy.   Discussed common side effects of medications like SSRI, SNRI, other  Low libido - labs today as below  Fatigue - discussed possible causes.  Labs as below, consider sleep study, continue with counseling  Elevated ALP - additional labs today as below   Kenneth Kennedy was  seen today for acute visit.  Diagnoses and all orders for this visit:  Depressed mood  Low libido -     Testosterone  Other fatigue -     Testosterone  Elevated alkaline phosphatase level -     Alkaline Phosphatase, Isoenzymes -     VITAMIN D 25 Hydroxy (Vit-D Deficiency, Fractures)     Follow up: pending labs

## 2022-03-19 LAB — ALKALINE PHOSPHATASE, ISOENZYMES

## 2022-03-19 LAB — TESTOSTERONE: Testosterone: 597 ng/dL (ref 264–916)

## 2022-03-21 LAB — VITAMIN D 25 HYDROXY (VIT D DEFICIENCY, FRACTURES): Vit D, 25-Hydroxy: 33.6 ng/mL (ref 30.0–100.0)

## 2022-03-21 LAB — ALKALINE PHOSPHATASE, ISOENZYMES: Alkaline Phosphatase: 146 IU/L — ABNORMAL HIGH (ref 44–121)

## 2022-03-22 ENCOUNTER — Other Ambulatory Visit: Payer: Self-pay | Admitting: Medical

## 2022-03-22 MED ORDER — VITAMIN D 50 MCG (2000 UT) PO CAPS: 1.0000 | ORAL_CAPSULE | Freq: Every day | ORAL | 3 refills | Status: DC

## 2022-03-22 MED ORDER — BUPROPION HCL ER (XL) 150 MG PO TB24: 150.0000 mg | ORAL_TABLET | ORAL | 2 refills | Status: DC

## 2022-03-26 ENCOUNTER — Telehealth: Payer: Self-pay

## 2022-03-26 NOTE — Telephone Encounter (Signed)
Pt. Was just calling back to let us know that he does want to do the sleep apnea test.

## 2022-03-26 NOTE — Telephone Encounter (Signed)
I have put order in for [facility] for home sleep test

## 2022-04-19 ENCOUNTER — Telehealth: Payer: Self-pay | Admitting: Medical

## 2022-04-19 NOTE — Telephone Encounter (Signed)
Fortunately the sleep study showed no significant sleep apnea.  I would recommend avoiding sleeping on his back, exercising regularly, avoiding sedating things like Benadryl or other sleep aids at bedtime.

## 2022-04-28 ENCOUNTER — Encounter: Payer: Self-pay | Admitting: Internal Medicine

## 2022-06-01 ENCOUNTER — Encounter: Payer: Self-pay | Admitting: Internal Medicine

## 2022-07-14 ENCOUNTER — Telehealth: Payer: 59 | Admitting: Family Medicine

## 2022-07-14 ENCOUNTER — Telehealth: Payer: 59 | Admitting: Physician Assistant

## 2022-07-14 ENCOUNTER — Encounter: Payer: Self-pay | Admitting: Physician Assistant

## 2022-07-14 DIAGNOSIS — J028 Acute pharyngitis due to other specified organisms: Secondary | ICD-10-CM

## 2022-07-14 DIAGNOSIS — B9789 Other viral agents as the cause of diseases classified elsewhere: Secondary | ICD-10-CM

## 2022-07-14 DIAGNOSIS — R109 Unspecified abdominal pain: Secondary | ICD-10-CM | POA: Diagnosis not present

## 2022-07-14 MED ORDER — PREDNISONE 10 MG (21) PO TBPK: ORAL_TABLET | ORAL | 0 refills | Status: DC

## 2022-07-14 NOTE — Patient Instructions (Signed)
  Kenneth Kennedy, thank you for joining Kenneth Finner, NP for today's virtual visit.  While this provider is not your primary care provider (PCP), if your PCP is located in our provider database this encounter information will be shared with them immediately following your visit.   A [facility] MyChart account gives you access to today's visit and all your visits, tests, and labs performed at [facility] " click here if you don't have a [name] MyChart account or go to [email or web address]  Consent: (Patient) Kenneth Kennedy provided verbal consent for this virtual visit at the beginning of the encounter.  Current Medications:  Current Outpatient Medications:    buPROPion (WELLBUTRIN XL) 150 MG 24 hr tablet, Take 1 tablet (150 mg total) by mouth every morning., Disp: 30 tablet, Rfl: 2   Celecoxib (CELEBREX PO), Take by mouth., Disp: , Rfl:    Cholecalciferol (VITAMIN D) 50 MCG (2000 UT) CAPS, Take 1 capsule (2,000 Units total) by mouth daily., Disp: 90 capsule, Rfl: 3   Medications ordered in this encounter:  No orders of the defined types were placed in this encounter.    *If you need refills on other medications prior to your next appointment, please contact your pharmacy*  Follow-Up: Call back or seek an in-person evaluation if the symptoms worsen or if the condition fails to improve as anticipated.  [name] [phone]  Other Instructions   If you have been instructed to have an in-person evaluation today at a local Urgent Care facility, please use the link below. It will take you to a list of all of our available [facility], including address, phone number and hours of operation. Please do not delay care.  [name]  If you or a family member do not have a primary care provider, use the link below to schedule a visit and establish care. When you choose a [facility] primary care physician or advanced practice  provider, you gain a long-term partner in health. Find a Primary Care Provider  Learn more about [name]'s in-office and virtual care options: [name] - Get Care Now

## 2022-07-14 NOTE — Addendum Note (Signed)
Addended by: [name] on: 07/14/2022 05:16 PM   Modules accepted: Orders

## 2022-07-14 NOTE — Progress Notes (Addendum)
Virtual Visit Consent   Kenneth Kennedy, you are scheduled for a virtual visit with a [facility] provider today. Just as with appointments in the office, your consent must be obtained to participate. Your consent will be active for this visit and any virtual visit you may have with one of our providers in the next 365 days. If you have a MyChart account, a copy of this consent can be sent to you electronically.  As this is a virtual visit, video technology does not allow for your provider to perform a traditional examination. This may limit your provider's ability to fully assess your condition. If your provider identifies any concerns that need to be evaluated in person or the need to arrange testing (such as labs, EKG, etc.), we will make arrangements to do so. Although advances in technology are sophisticated, we cannot ensure that it will always work on either your end or our end. If the connection with a video visit is poor, the visit may have to be switched to a telephone visit. With either a video or telephone visit, we are not always able to ensure that we have a secure connection.  By engaging in this virtual visit, you consent to the provision of healthcare and authorize for your insurance to be billed (if applicable) for the services provided during this visit. Depending on your insurance coverage, you may receive a charge related to this service.  I need to obtain your verbal consent now. Are you willing to proceed with your visit today? Kenneth Kennedy has provided verbal consent on 07/14/2022 for a virtual visit (video or telephone). [name], NP  Date: 07/14/2022 4:20 PM  Virtual Visit via Video Note   I, [name], connected with  Kenneth Kennedy  ([id number], [birth year]) on 07/14/22 at  4:15 PM EST by a video-enabled telemedicine application and verified that I am speaking with the correct person using two identifiers.  Location: Patient: Virtual Visit Location Patient:  Home Provider: Virtual Visit Location Provider: Home Office   I discussed the limitations of evaluation and management by telemedicine and the availability of in person appointments. The patient expressed understanding and agreed to proceed.    History of Present Illness: Kenneth Kennedy is a 21 y.o. who identifies as a male who was assigned male at birth, and is being seen today for sore throat. This started two days ago. Reports throat is red, mildly swollen, does not see white patches at this time. He reports no other URI symptoms. Does have a mild headache that has been intermittent in nature. Today he work with pains that start at base of rib cage and go down the sides. Denies fevers, chills, chest pain, shortness of breath, flank pain (no history of kidney stones). Denies changes in bladder or bowel habits. Reports drinking several bottles of water daily-urine is yellow- light, not dark. Denies groin pain. Denies recent injury, trauma. Denies exposure to STI. Denies known contacts with strep, covid, or flu.   Tried to take Tylenol without much relief of side pains.  Of note he did help his brother move apartments and lifted a couch and fridge. There was no noted pain when this was taking place over the last weekend.  He also notes going to a hockey game. He reports last year he went to the hockey game and that is when he developed covid and tonsillitis.     Problems:  Patient Active Problem List   Diagnosis Date  Noted   Alkaline phosphatase elevation 02/03/2022   Depression, major, single episode, moderate ([place]) 01/06/2022   Encounter for health maintenance examination in adult 08/12/2021   Vaccine counseling 08/12/2021   Influenza vaccination declined 08/12/2021   BMI 33.0-33.9,adult 08/12/2021    Allergies:  Allergies  Allergen Reactions   Cleocin [Clindamycin] Hives and Nausea And Vomiting   Medications:  Current Outpatient Medications:    buPROPion (WELLBUTRIN XL) 150 MG 24  hr tablet, Take 1 tablet (150 mg total) by mouth every morning., Disp: 30 tablet, Rfl: 2   Celecoxib (CELEBREX PO), Take by mouth., Disp: , Rfl:    Cholecalciferol (VITAMIN D) 50 MCG (2000 UT) CAPS, Take 1 capsule (2,000 Units total) by mouth daily., Disp: 90 capsule, Rfl: 3  Observations/Objective: Patient is well-developed, well-nourished in no acute distress.  Resting comfortably  at home.  Head is normocephalic, atraumatic.  No labored breathing.  Speech is clear and coherent with logical content.  Patient is alert and oriented at baseline.    Assessment and Plan:  1. Sore throat  Having pt test and report back if COVID positive. Suspicion is for tonsillitis questionable viral vs bacterial If covid negative given Hx of tonsillitis in past ans symptoms will consider medication.  Add: Steroid dose pack for viral sore throat, since covid negative, hopefully will help with side pains as well. In person need if not improved and detailed in note to him.    2. Side pain  -NSAID use discussed -could be body aches related to lifting heavy (though delayed) -or body aches related to covid or another illness   Follow Up Instructions: I discussed the assessment and treatment plan with the patient. The patient was provided an opportunity to ask questions and all were answered. The patient agreed with the plan and demonstrated an understanding of the instructions.  A copy of instructions were sent to the patient via MyChart unless otherwise noted below.     The patient was advised to call back or seek an in-person evaluation if the symptoms worsen or if the condition fails to improve as anticipated.  Time:  I spent 10 minutes with the patient via telehealth technology discussing the above problems/concerns.    Perlie Mayo, NP

## 2022-07-14 NOTE — Progress Notes (Signed)
Converted to video

## 2022-08-18 ENCOUNTER — Encounter: Payer: 59 | Admitting: Medical

## 2022-12-21 ENCOUNTER — Encounter: Payer: Self-pay | Admitting: Medical

## 2022-12-21 ENCOUNTER — Ambulatory Visit: Payer: 59 | Admitting: Medical

## 2022-12-21 VITALS — BP 132/82 | HR 81 | Ht 76.0 in | Wt 287.8 lb

## 2022-12-21 DIAGNOSIS — Z13220 Encounter for screening for lipoid disorders: Secondary | ICD-10-CM | POA: Insufficient documentation

## 2022-12-21 DIAGNOSIS — R748 Abnormal levels of other serum enzymes: Secondary | ICD-10-CM | POA: Diagnosis not present

## 2022-12-21 DIAGNOSIS — Z0000 Encounter for general adult medical examination without abnormal findings: Secondary | ICD-10-CM | POA: Diagnosis not present

## 2022-12-21 DIAGNOSIS — Z6835 Body mass index (BMI) 35.0-35.9, adult: Secondary | ICD-10-CM | POA: Diagnosis not present

## 2022-12-21 DIAGNOSIS — E559 Vitamin D deficiency, unspecified: Secondary | ICD-10-CM | POA: Insufficient documentation

## 2022-12-21 DIAGNOSIS — R231 Pallor: Secondary | ICD-10-CM

## 2022-12-21 DIAGNOSIS — R209 Unspecified disturbances of skin sensation: Secondary | ICD-10-CM

## 2022-12-21 LAB — POCT URINALYSIS DIP (PROADVANTAGE DEVICE)
Bilirubin, UA: NEGATIVE
Blood, UA: NEGATIVE
Glucose, UA: NEGATIVE mg/dL
Ketones, POC UA: NEGATIVE mg/dL
Nitrite, UA: NEGATIVE
Protein Ur, POC: NEGATIVE mg/dL
Specific Gravity, Urine: 1.015
Urobilinogen, Ur: 0.2
pH, UA: 6.5 (ref 5.0–8.0)

## 2022-12-21 LAB — CBC
MCH: 30.8 pg (ref 26.6–33.0)
RBC: 5.56 x10E6/uL (ref 4.14–5.80)
RDW: 13.3 % (ref 11.6–15.4)

## 2022-12-21 LAB — COMPREHENSIVE METABOLIC PANEL

## 2022-12-21 LAB — LIPID PANEL

## 2022-12-21 NOTE — Progress Notes (Signed)
Subjective:   HPI  Kenneth Kennedy is a 22 y.o. male who presents for Chief Complaint  Patient presents with   Annual Exam    Fasting annual exam. No concerns.     Patient Care Team: Ramisa Duman, Cleda Mccreedy as PCP - General (Family Medicine) Sees dentist No recent eye doctor   Concerns: For years gets cold and sweaty hands and feet, clammy and pale.   Curious about this.   Reviewed their medical, surgical, family, social, medication, and allergy history and updated chart as appropriate.  Past Medical History:  Diagnosis Date   History of depression 2023    Past Surgical History:  Procedure Laterality Date   TYMPANOSTOMY TUBE PLACEMENT      Family History  Problem Relation Age of Onset   Depression Mother    Anxiety disorder Mother    Cancer Father        melanoma   Hydrocephalus Brother    Diabetes Other    Stroke Other    Heart disease Other      Current Outpatient Medications:    Cholecalciferol (VITAMIN D) 50 MCG (2000 UT) CAPS, Take 1 capsule (2,000 Units total) by mouth daily., Disp: 90 capsule, Rfl: 3  Allergies  Allergen Reactions   Cleocin [Clindamycin] Hives and Nausea And Vomiting    Review of Systems  Constitutional:  Negative for chills, fever, malaise/fatigue and weight loss.  HENT:  Negative for congestion, ear pain, hearing loss, sore throat and tinnitus.   Eyes:  Negative for blurred vision, pain and redness.  Respiratory:  Negative for cough, hemoptysis and shortness of breath.   Cardiovascular:  Negative for chest pain, palpitations, orthopnea, claudication and leg swelling.       Concerns about blood flow in arms and legs  Gastrointestinal:  Negative for abdominal pain, blood in stool, constipation, diarrhea, nausea and vomiting.  Genitourinary:  Negative for dysuria, flank pain, frequency, hematuria and urgency.  Musculoskeletal:  Negative for falls, joint pain and myalgias.  Skin:  Negative for itching and rash.  Neurological:   Negative for dizziness, tingling, speech change, weakness and headaches.  Endo/Heme/Allergies:  Negative for polydipsia. Does not bruise/bleed easily.  Psychiatric/Behavioral:  Negative for depression and memory loss. The patient is not nervous/anxious and does not have insomnia.         12/21/2022    1:30 PM 12/21/2022    1:29 PM 02/03/2022    8:55 AM 01/06/2022    2:01 PM 08/12/2021   10:18 AM  Depression screen PHQ 2/9  Decreased Interest 0 0 1 2 0  Down, Depressed, Hopeless 0 0 1 2 0  PHQ - 2 Score 0 0 2 4 0  Altered sleeping 0  1 3   Tired, decreased energy 0  2 3   Change in appetite 0  2 3   Feeling bad or failure about yourself  0  2 3   Trouble concentrating 0  1 1   Moving slowly or fidgety/restless 0  1 1   Suicidal thoughts 0  0 0   PHQ-9 Score 0  11 18   Difficult doing work/chores Not difficult at all  Somewhat difficult Somewhat difficult         Objective:  BP 132/82   Pulse 81   Ht 6\' 4"  (1.93 m)   Wt 287 lb 12.8 oz (130.5 kg)   SpO2 97%   BMI 35.03 kg/m   Wt Readings from Last 3 Encounters:  12/21/22 287  lb 12.8 oz (130.5 kg)  03/18/22 275 lb (124.7 kg)  02/03/22 274 lb (124.3 kg)   BP Readings from Last 3 Encounters:  12/21/22 132/82  03/18/22 130/80  02/03/22 118/70    General appearance: alert, no distress, WD/WN, Caucasian male Skin: pallor of feet and hands, no cracking open of skin, scattered macules, no worrisome lesions HEENT: normocephalic, conjunctiva/corneas normal, sclerae anicteric, PERRLA, EOMi Neck: supple, no lymphadenopathy, no thyromegaly, no masses, normal ROM, no bruits Chest: non tender, normal shape and expansion Heart: RRR, normal S1, S2, no murmurs Lungs: CTA bilaterally, no wheezes, rhonchi, or rales Abdomen: +bs, soft, non tender, non distended, no masses, no hepatomegaly, no splenomegaly, no bruits Back: non tender, normal ROM, no scoliosis Musculoskeletal: upper extremities non tender, no obvious deformity, normal  ROM throughout, lower extremities non tender, no obvious deformity, normal ROM throughout Extremities: no edema, no cyanosis, no clubbing Pulses: 2+ symmetric, upper and lower extremities, slightly delayed bilat cap refill UE and LE Neurological: alert, oriented x 3, CN2-12 intact, strength normal upper extremities and lower extremities, sensation normal throughout, DTRs 2+ throughout, no cerebellar signs, gait normal Psychiatric: normal affect, behavior normal, pleasant  GU: deferred Rectal: deferred     Assessment and Plan :   Encounter Diagnoses  Name Primary?   Annual physical exam Yes   Alkaline phosphatase elevation    BMI 35.0-35.9,adult    Screening for lipid disorders    Vitamin D deficiency    Cold extremities    Pallor of extremity     This visit was a preventative care visit, also known as wellness visit or routine physical.   Topics typically include healthy lifestyle, diet, exercise, preventative care, vaccinations, sick and well care, proper use of emergency dept and after hours care, as well as other concerns.     Recommendations: Continue to return yearly for your annual wellness and preventative care visits.  This gives [place] a chance to discuss healthy lifestyle, exercise, vaccinations, review your chart record, and perform screenings where appropriate.  I recommend you see your dentist yearly for routine dental care including hygiene visits twice yearly.   Vaccination recommendations were reviewed Immunization History  Administered Date(s) Administered   DTaP 03/20/2001, 05/24/2001, 08/09/2001, 04/16/2002, 02/03/2005   HIB (PRP-OMP) 03/20/2001, 05/24/2001, 08/09/2001, 04/16/2002   HPV 9-valent 02/05/2015, 03/03/2016   Hepatitis A 02/03/2005, 08/06/2005   Hepatitis B 10/03/2000, 02/17/2001, 11/08/2001   IPV 03/20/2001, 05/24/2001, 11/08/2001, 02/03/2005   MMR 01/15/2002, 02/03/2005   Meningococcal B, OMV 02/15/2017, 05/09/2017   Meningococcal Conjugate  06/06/2013, 02/15/2017   PFIZER(Purple Top)SARS-COV-2 Vaccination 11/03/2019, 11/28/2019   Pneumococcal Conjugate-13 03/20/2001, 05/24/2001, 08/09/2001, 01/15/2002   Tdap 06/21/2011, 07/17/2018   Varicella 01/15/2002, 04/15/2006     Screening for cancer: Colon cancer screening: Age 18  Testicular cancer screening You should do a monthly self testicular exam if you are between 48-56 years old  We discussed PSA, prostate exam, and prostate cancer screening risks/benefits.   Age 33  Skin cancer screening: Check your skin regularly for new changes, growing lesions, or other lesions of concern Come in for evaluation if you have skin lesions of concern.  Lung cancer screening: If you have a greater than 20 pack year history of tobacco use, then you may qualify for lung cancer screening with a chest CT scan.   Please call your insurance company to inquire about coverage for this test.  We currently don't have screenings for other cancers besides breast, cervical, colon, and lung cancers.  If you  have a strong family history of cancer or have other cancer screening concerns, please let me know.     Heart health: Get at least 150 minutes of aerobic exercise weekly Limit alcohol It is important to maintain a healthy blood pressure and healthy cholesterol numbers  Heart disease screening: Screening for heart disease includes screening for blood pressure, fasting lipids, glucose/diabetes screening, BMI height to weight ratio, reviewed of smoking status, physical activity, and diet.    Goals include blood pressure 120/80 or less, maintaining a healthy lipid/cholesterol profile, preventing diabetes or keeping diabetes numbers under good control, not smoking or using tobacco products, exercising most days per week or at least 150 minutes per week of exercise, and eating healthy variety of fruits and vegetables, healthy oils, and avoiding unhealthy food choices like fried food, fast food, high  sugar and high cholesterol foods.       Medical care options: I recommend you continue to seek care here first for routine care.  We try really hard to have available appointments Monday through Friday daytime hours for sick visits, acute visits, and physicals.  Urgent care should be used for after hours and weekends for significant issues that cannot wait till the next day.  The emergency department should be used for significant potentially life-threatening emergencies.  The emergency department is expensive, can often have long wait times for less significant concerns, so try to utilize primary care, urgent care, or telemedicine when possible to avoid unnecessary trips to the emergency department.  Virtual visits and telemedicine have been introduced since the pandemic started in 2020, and can be convenient ways to receive medical care.  We offer virtual appointments as well to assist you in a variety of options to seek medical care.    Separate significant issues discussed: cold extremities, pallor-discussed possible causes, could be Raynaud's phenomenon versus early.  Normal pulses today.  Pulse oximetry on fingers on both hands normal.  Discussed possibly using nifedipine or other for symptom relief.   Camren was seen today for annual exam.  Diagnoses and all orders for this visit:  Annual physical exam -     POCT Urinalysis DIP (Proadvantage Device) -     Visual acuity screening -     Comprehensive metabolic panel -     VITAMIN D 25 Hydroxy (Vit-D Deficiency, Fractures) -     CBC -     Lipid panel -     Sedimentation rate -     ANA  Alkaline phosphatase elevation  BMI 35.0-35.9,adult  Screening for lipid disorders -     Lipid panel  Vitamin D deficiency -     VITAMIN D 25 Hydroxy (Vit-D Deficiency, Fractures) -     Sedimentation rate -     ANA  Cold extremities -     Sedimentation rate -     ANA  Pallor of extremity -     Sedimentation rate -     ANA   Follow-up  pending labs, yearly for physical

## 2022-12-22 ENCOUNTER — Other Ambulatory Visit: Payer: Self-pay | Admitting: Medical

## 2022-12-22 LAB — COMPREHENSIVE METABOLIC PANEL
ALT: 66 IU/L — ABNORMAL HIGH (ref 0–44)
Albumin/Globulin Ratio: 1.6 (ref 1.2–2.2)
Albumin: 4.7 g/dL (ref 4.3–5.2)
BUN/Creatinine Ratio: 11 (ref 9–20)
Bilirubin Total: 0.7 mg/dL (ref 0.0–1.2)
CO2: 22 mmol/L (ref 20–29)
Calcium: 9.7 mg/dL (ref 8.7–10.2)
Creatinine, Ser: 1.11 mg/dL (ref 0.76–1.27)
Glucose: 91 mg/dL (ref 70–99)
Sodium: 139 mmol/L (ref 134–144)
Total Protein: 7.6 g/dL (ref 6.0–8.5)

## 2022-12-22 LAB — CBC
Hematocrit: 49.8 % (ref 37.5–51.0)
Hemoglobin: 17.1 g/dL (ref 13.0–17.7)
MCHC: 34.3 g/dL (ref 31.5–35.7)
MCV: 90 fL (ref 79–97)
Platelets: 285 10*3/uL (ref 150–450)
WBC: 8.4 10*3/uL (ref 3.4–10.8)

## 2022-12-22 LAB — LIPID PANEL
Cholesterol, Total: 219 mg/dL — ABNORMAL HIGH (ref 100–199)
Triglycerides: 184 mg/dL — ABNORMAL HIGH (ref 0–149)

## 2022-12-22 LAB — VITAMIN D 25 HYDROXY (VIT D DEFICIENCY, FRACTURES): Vit D, 25-Hydroxy: 25.1 ng/mL — ABNORMAL LOW (ref 30.0–100.0)

## 2022-12-22 LAB — ANA: Anti Nuclear Antibody (ANA): NEGATIVE

## 2022-12-22 LAB — SEDIMENTATION RATE: Sed Rate: 2 mm/hr (ref 0–15)

## 2022-12-22 MED ORDER — VITAMIN D (ERGOCALCIFEROL) 1.25 MG (50000 UNIT) PO CAPS: 50000.0000 [IU] | ORAL_CAPSULE | ORAL | 0 refills | Status: DC

## 2022-12-22 NOTE — Progress Notes (Signed)
Results sent through MyChart

## 2022-12-22 NOTE — Progress Notes (Signed)
Results sent through MyChart.  Please schedule 3 month follow-up on vitamin D deficiency, elevated liver tests, high cholesterol

## 2023-01-17 ENCOUNTER — Ambulatory Visit (INDEPENDENT_AMBULATORY_CARE_PROVIDER_SITE_OTHER): Payer: 59

## 2023-01-17 ENCOUNTER — Ambulatory Visit (HOSPITAL_COMMUNITY)
Admission: EM | Admit: 2023-01-17 | Discharge: 2023-01-17 | Disposition: A | Discharge status: Home / Self Care | Payer: 59 | Attending: Emergency Medicine | Admitting: Emergency Medicine

## 2023-01-17 ENCOUNTER — Encounter (HOSPITAL_COMMUNITY): Payer: Self-pay

## 2023-01-17 DIAGNOSIS — M25532 Pain in left wrist: Secondary | ICD-10-CM | POA: Diagnosis not present

## 2023-01-17 MED ORDER — IBUPROFEN 800 MG PO TABS: 800.0000 mg | ORAL_TABLET | Freq: Three times a day (TID) | ORAL | 0 refills | Status: DC

## 2023-01-17 NOTE — ED Provider Notes (Signed)
[facility]    CSN: [id number] Arrival date & time: 01/17/23  1211      History   Chief Complaint Chief Complaint  Patient presents with   left wrist injury    HPI Kenneth Kennedy is a 22 y.o. male.  Here with left wrist pain for 5 days.  Originally started when playing golf; heard a pop.  A couple days ago at work he felt another pop and his pain and swelling seem to worsen.  Today rating 4/10 pain No numbness or tingling of the extremity.  Denies weakness.  No prior injury known  He works maintenance and uses hands/wrists a lot  Past Medical History:  Diagnosis Date   History of depression 2023    Patient Active Problem List   Diagnosis Date Noted   Annual physical exam 12/21/2022   BMI 35.0-35.9,adult 12/21/2022   Screening for lipid disorders 12/21/2022   Vitamin D deficiency 12/21/2022   Alkaline phosphatase elevation 02/03/2022   Encounter for health maintenance examination in adult 08/12/2021    Past Surgical History:  Procedure Laterality Date   TYMPANOSTOMY TUBE PLACEMENT        Home Medications    Prior to Admission medications   Medication Sig Start Date End Date Taking? Authorizing Provider  ibuprofen (ADVIL) 800 MG tablet Take 1 tablet (800 mg total) by mouth 3 (three) times daily. 01/17/23  Yes Catherina Pates, Lurena Joiner, PA-C  Vitamin D, Ergocalciferol, (DRISDOL) 1.25 MG (50000 UNIT) CAPS capsule Take 1 capsule (50,000 Units total) by mouth every 7 (seven) days. 12/22/22   Tysinger, Kermit Balo, PA-C    Family History Family History  Problem Relation Age of Onset   Depression Mother    Anxiety disorder Mother    Cancer Father        melanoma   Hydrocephalus Brother    Diabetes Other    Stroke Other    Heart disease Other     Social History Social History   Tobacco Use   Smoking status: Never   Smokeless tobacco: Never  Vaping Use   Vaping Use: Never used  Substance Use Topics   Alcohol use: No   Drug use: No     Allergies    Cleocin [clindamycin]   Review of Systems Review of Systems As per HPI  Physical Exam Triage Vital Signs ED Triage Vitals  Enc Vitals Group     BP 01/17/23 1335 120/80     Pulse Rate 01/17/23 1335 74     Resp 01/17/23 1335 16     Temp 01/17/23 1335 98 F (36.7 C)     Temp Source 01/17/23 1335 Oral     SpO2 01/17/23 1335 98 %     Weight --      Height --      Head Circumference --      Peak Flow --      Pain Score 01/17/23 1336 4     Pain Loc --      Pain Edu? --      Excl. in GC? --    No data found.  Updated Vital Signs BP 120/80 (BP Location: Left Arm)   Pulse 74   Temp 98 F (36.7 C) (Oral)   Resp 16   SpO2 98%    Physical Exam Vitals and nursing note reviewed.  Constitutional:      General: He is not in acute distress. HENT:     Mouth/Throat:     Pharynx: Oropharynx is  clear.  Cardiovascular:     Rate and Rhythm: Normal rate and regular rhythm.     Pulses: Normal pulses.  Pulmonary:     Effort: Pulmonary effort is normal.  Musculoskeletal:        General: Normal range of motion.     Left wrist: Tenderness present. No swelling, deformity or bony tenderness. Normal pulse.     Comments: Grip strength intact. Good ROM of wrist and fingers. No deformity or obvious swelling. Sensation intact, cap refill < 2 seconds radial pulse strong.   Skin:    General: Skin is warm and dry.     Capillary Refill: Capillary refill takes less than 2 seconds.  Neurological:     Mental Status: He is alert and oriented to person, place, and time.     UC Treatments / Results  Labs (all labs ordered are listed, but only abnormal results are displayed) Labs Reviewed - No data to display  EKG  Radiology DG Wrist Complete Left  Result Date: 01/17/2023 CLINICAL DATA:  golf injury, pop heard, swelling EXAM: LEFT WRIST - COMPLETE 3+ VIEW COMPARISON:  None Available. FINDINGS: There is no evidence of fracture or dislocation. There is no evidence of arthropathy or other  focal bone abnormality. Soft tissues are unremarkable. IMPRESSION: Negative. Electronically Signed   By: Corlis Leak M.D.   On: 01/17/2023 14:11    Procedures Procedures (including critical care time)  Medications Ordered in UC Medications - No data to display  Initial Impression / Assessment and Plan / UC Course  I have reviewed the triage vital signs and the nursing notes.  Pertinent labs & imaging results that were available during my care of the patient were reviewed by me and considered in my medical decision making (see chart for details).  Left wrist xray negative Discussed soft tissue injury, RICE therapy. Brace applied in clinic. Ibuprofen/tylenol for pain control and inflammation Can follow with ortho if persisting He works maintenance and uses hands a lot. Discussed overuse can re-injure or worsen the current injury.  Final Clinical Impressions(s) / UC Diagnoses   Final diagnoses:  Left wrist pain     Discharge Instructions      The xray is negative. You may have a soft tissue injury   Rest - try to avoid heavy lifting and high impact activity Ice - apply for 20 minutes a few times daily Compression - use wrist brace as needed Elevation - prop up on a pillow  Ibuprofen and/or tylenol for pain and swelling  Please follow up with orthopedics if symptoms persist.     ED Prescriptions     Medication Sig Dispense Auth. Provider   ibuprofen (ADVIL) 800 MG tablet Take 1 tablet (800 mg total) by mouth 3 (three) times daily. 21 tablet Tom Macpherson, Lurena Joiner, PA-C      PDMP not reviewed this encounter.   Marlow Baars, [place] 01/17/23 1515

## 2023-01-17 NOTE — ED Triage Notes (Signed)
Pt c/o left wrist pain onset ~ Wednesday when playing golf he hit a ball and felt a "pop" then a few days later at work he was using the wrist when another "pop" occurred and since the pain and swelling has increased.

## 2023-01-17 NOTE — Discharge Instructions (Addendum)
The xray is negative. You may have a soft tissue injury   Rest - try to avoid heavy lifting and high impact activity Ice - apply for 20 minutes a few times daily Compression - use wrist brace as needed Elevation - prop up on a pillow  Ibuprofen and/or tylenol for pain and swelling  Please follow up with orthopedics if symptoms persist.

## 2023-02-11 ENCOUNTER — Encounter: Payer: Self-pay | Admitting: Family Medicine

## 2023-02-11 ENCOUNTER — Ambulatory Visit: Payer: 59 | Admitting: Family Medicine

## 2023-02-11 VITALS — BP 124/76 | HR 88 | Wt 286.4 lb

## 2023-02-11 DIAGNOSIS — I8392 Asymptomatic varicose veins of left lower extremity: Secondary | ICD-10-CM

## 2023-02-11 NOTE — Progress Notes (Signed)
   Subjective:    Patient ID: Kenneth Kennedy, male    DOB: [birth year], 22 y.o.   MRN: [id number]  HPI He is here for evaluation of superficial vein swelling to the left medial thigh.  He is concerned for DVT.   Review of Systems     Objective:    Physical Exam He does have superficial varicosities present to the left medial thigh area.  It is nontender and not hot or red.       Assessment & Plan:  Asymptomatic varicose veins of left lower extremity I explained that this is a superficial varicose vein and not related to any deep vein issues.  The main thing is if he starts having pain in that area it could be from phlebitis and to be treated conservatively.  Did discuss the possibility of having an ablation done at some point when he is ready.

## 2023-03-15 ENCOUNTER — Telehealth: Payer: 59 | Admitting: Physician Assistant

## 2023-03-15 DIAGNOSIS — R519 Headache, unspecified: Secondary | ICD-10-CM

## 2023-03-15 DIAGNOSIS — F0781 Postconcussional syndrome: Secondary | ICD-10-CM

## 2023-03-15 MED ORDER — PREDNISONE 10 MG (21) PO TBPK
ORAL_TABLET | ORAL | 0 refills | Status: DC
Start: 2023-03-15 — End: 2023-04-06

## 2023-03-15 MED ORDER — TIZANIDINE HCL 2 MG PO TABS
2.0000 mg | ORAL_TABLET | Freq: Four times a day (QID) | ORAL | 0 refills | Status: DC | PRN
Start: 2023-03-15 — End: 2023-04-06

## 2023-03-15 NOTE — Patient Instructions (Signed)
Kenneth Kennedy, thank you for joining [name], PA-C for today's virtual visit.  While this provider is not your primary care provider (PCP), if your PCP is located in our provider database this encounter information will be shared with them immediately following your visit.   A [facility] MyChart account gives you access to today's visit and all your visits, tests, and labs performed at [facility] " click here if you don't have a [facility] MyChart account or go to [email or web address]  Consent: (Patient) Kenneth Kennedy provided verbal consent for this virtual visit at the beginning of the encounter.  Current Medications:  Current Outpatient Medications:    ibuprofen (ADVIL) 800 MG tablet, Take 1 tablet (800 mg total) by mouth 3 (three) times daily., Disp: 21 tablet, Rfl: 0   Vitamin D, Ergocalciferol, (DRISDOL) 1.25 MG (50000 UNIT) CAPS capsule, Take 1 capsule (50,000 Units total) by mouth every 7 (seven) days., Disp: 12 capsule, Rfl: 0   Medications ordered in this encounter:  No orders of the defined types were placed in this encounter.    *If you need refills on other medications prior to your next appointment, please contact your pharmacy*  Follow-Up: Call back or seek an in-person evaluation if the symptoms worsen or if the condition fails to improve as anticipated.  [facility] [phone]  Other Instructions   Post-Concussion Syndrome  A concussion is a brain injury from a direct hit to the head or body. This hit causes the brain to shake back and forth fast inside the skull. The shaking can damage brain cells and cause chemical changes in the brain. Concussions are normally not life-threatening but can cause serious symptoms. Post-concussion syndrome is when symptoms that happen after a concussion last longer than normal. These symptoms can last from weeks to months. What are the causes? The cause of this condition is not  known. It can happen whether your head injury was mild or severe. What increases the risk? You are more likely to get this condition if: You are male. You are a child, teen, or young adult. You have had a head injury before. You have a history of headaches. You have depression or anxiety. You have more than one symptom or severe symptoms when your concussion occurs. You faint or cannot remember the event (have amnesia of the event). What are the signs or symptoms? Symptoms of this condition include: Physical symptoms, such as: Headaches. Tiredness. Dizziness and weakness. Blurred vision and sensitivity to light. Trouble hearing. Problems with balance. Mental and emotional symptoms, such as: Memory problems and trouble focusing. Trouble falling asleep or staying asleep (insomnia). Feeling irritable. Anxiety or depression. Trouble learning new things. How is this diagnosed? This condition may be diagnosed based on: Your symptoms. A description of your injury. Your medical history. Testing your strength, balance, and nerve function (neurological exam). Your health care provider may order other tests. These may include: Brain imaging, such as a CT scan or an MRI. Memory testing (neuropsychological testing). How is this treated? Treatment for this condition may depend on your symptoms. Symptoms normally go away on their own with time. If you need treatment, it may include: Medicines for headaches, anxiety, depression, and insomnia. Resting your brain and body for a few days after your injury. Rehab therapy, such as: Physical or occupational therapy. This may include exercises to help with balance and dizziness. Mental health counseling. A form of talk therapy called cognitive behavioral therapy (CBT) can  be especially helpful. This therapy helps you set goals and follow up on the changes that you make. Speech therapy. Vision therapy. A brain and eye specialist can recommend  treatments for vision problems. Follow these instructions at home: Medicines Take over-the-counter and prescription medicines only as told by your health care provider. Avoid opioid prescription pain medicines when getting over a concussion. Activity Limit your mental activities for the first few days after your injury. This may include not doing these things: Homework or work for your job. Complex thinking. Watching TV. Using a computer or phone. Playing memory games and puzzles. Slowly return to your normal activity level. If a certain activity brings on your symptoms, stop or slow down until you can do the activity without getting symptoms again. Limit physical activity, such as sports or strenuous activities, for the first few days after a concussion. Slowly return to normal activity as told by your health care provider. Light exercise may be helpful. Rest helps your brain heal. Make sure you: Get plenty of sleep at night. Most adults should get at least 7-9 hours of sleep each night. Rest during the day. Take naps or rest breaks when you feel tired. Do not do high-risk activities that could cause a second concussion, such as riding a bike or playing sports. Having another concussion before the first one has healed can be harmful. General instructions  Do not drink alcohol until your health care provider says that you can. Keep track of how severe your symptoms are and how often they happen. Give this information to your health care provider. Keep all follow-up visits. Your health care provider may need to check you for new or serious symptoms. Where to find more information Conc[employer]: concussionfoundation.org Contact a health care provider if: Your symptoms do not improve. You get injured again. You have unusual behavior changes. Get help right away if: You have a severe or worsening headache. You have weakness or numbness in any part of your body. You vomit  repeatedly. You have mental status changes, such as: Confusion. Trouble speaking. Trouble staying awake. Fainting. You have a seizure. These symptoms may be an emergency. Get help right away. Call 911. Do not wait to see if the symptoms will go away. Do not drive yourself to the hospital. Also, get help right away if: You think about hurting yourself or others. Take one of these steps if you feel like you may hurt yourself or others, or have thoughts about taking your own life: Go to your nearest emergency room. Call 911. Call the National Suicide Prevention Lifeline at 325-173-3450 or 988. This is open 24 hours a day. Text the Crisis Text Line at 825 860 3310. This information is not intended to replace advice given to you by your health care provider. Make sure you discuss any questions you have with your health care provider. Document Revised: 01/01/2022 Document Reviewed: 01/01/2022 Elsevier Patient Education  2024 Elsevier Inc.    If you have been instructed to have an in-person evaluation today at a local Urgent Care facility, please use the link below. It will take you to a list of all of our available [facility], including address, phone number and hours of operation. Please do not delay care.  [facility]  If you or a family member do not have a primary care provider, use the link below to schedule a visit and establish care. When you choose a [facility] primary care physician or advanced  practice provider, you gain a long-term partner in health. Find a Primary Care Provider  Learn more about [facility] in-office and virtual care options: [facility] - Get Care Now

## 2023-03-15 NOTE — Progress Notes (Signed)
Virtual Visit Consent   Kenneth Kennedy, you are scheduled for a virtual visit with a [facility] provider today. Just as with appointments in the office, your consent must be obtained to participate. Your consent will be active for this visit and any virtual visit you may have with one of our providers in the next 365 days. If you have a MyChart account, a copy of this consent can be sent to you electronically.  As this is a virtual visit, video technology does not allow for your provider to perform a traditional examination. This may limit your provider's ability to fully assess your condition. If your provider identifies any concerns that need to be evaluated in person or the need to arrange testing (such as labs, EKG, etc.), we will make arrangements to do so. Although advances in technology are sophisticated, we cannot ensure that it will always work on either your end or our end. If the connection with a video visit is poor, the visit may have to be switched to a telephone visit. With either a video or telephone visit, we are not always able to ensure that we have a secure connection.  By engaging in this virtual visit, you consent to the provision of healthcare and authorize for your insurance to be billed (if applicable) for the services provided during this visit. Depending on your insurance coverage, you may receive a charge related to this service.  I need to obtain your verbal consent now. Are you willing to proceed with your visit today? Kenneth Kennedy has provided verbal consent on 03/15/2023 for a virtual visit (video or telephone). [name], [place]  Date: 03/15/2023 11:20 AM  Virtual Visit via Video Note   I, [name], connected with  Kenneth Kennedy  ([id number], Mar 26, 2001) on 03/15/23 at 11:15 AM EDT by a video-enabled telemedicine application and verified that I am speaking with the correct person using two identifiers.  Location: Patient: Virtual Visit Location  Patient: Home Provider: Virtual Visit Location Provider: Home Office   I discussed the limitations of evaluation and management by telemedicine and the availability of in person appointments. The patient expressed understanding and agreed to proceed.    History of Present Illness: Kenneth Kennedy is a 22 y.o. who identifies as a male who was assigned male at birth, and is being seen today for concern of concussion and residual symptoms. Notes injury occurred last Wednesday (6 days ago).Was on golf cart and branch smacked him in the head at that time. Noted it hurt considerably but could not see where it left any marks, no LOC. 30 minutes later with mild headache. Took it easy Thursday, noting was harder to rest. Still with daily headache since that time, worse at top of the head. Denies vision changes. Occasional nausea without vomiting. Worse at night -- lights from screen. No associated pain of neck and shoulders.  OTC -- Tylenol -- helps somewhat.  Keeping hydrated. Appetite is doing ok.    HPI: HPI  Problems:  Patient Active Problem List   Diagnosis Date Noted   Annual physical exam 12/21/2022   BMI 35.0-35.9,adult 12/21/2022   Screening for lipid disorders 12/21/2022   Vitamin D deficiency 12/21/2022   Alkaline phosphatase elevation 02/03/2022   Encounter for health maintenance examination in adult 08/12/2021    Allergies:  Allergies  Allergen Reactions   Cleocin [Clindamycin] Hives and Nausea And Vomiting   Medications:  Current Outpatient Medications:    predniSONE (STERAPRED UNI-PAK  21 TAB) 10 MG (21) TBPK tablet, Take following package directions, Disp: 21 tablet, Rfl: 0   tiZANidine (ZANAFLEX) 2 MG tablet, Take 1 tablet (2 mg total) by mouth every 6 (six) hours as needed for muscle spasms., Disp: 30 tablet, Rfl: 0   Vitamin D, Ergocalciferol, (DRISDOL) 1.25 MG (50000 UNIT) CAPS capsule, Take 1 capsule (50,000 Units total) by mouth every 7 (seven) days., Disp: 12 capsule,  Rfl: 0  Observations/Objective: Patient is well-developed, well-nourished in no acute distress.  Resting comfortably at home.  Head is normocephalic, atraumatic.  No labored breathing. Speech is clear and coherent with logical content.  Patient is alert and oriented at baseline.  Neuro exam grossly intact.  Assessment and Plan: 1. Post concussion syndrome - predniSONE (STERAPRED UNI-PAK 21 TAB) 10 MG (21) TBPK tablet; Take following package directions  Dispense: 21 tablet; Refill: 0 - tiZANidine (ZANAFLEX) 2 MG tablet; Take 1 tablet (2 mg total) by mouth every 6 (six) hours as needed for muscle spasms.  Dispense: 30 tablet; Refill: 0  Mild. No alarm signs or symptoms. Prednisone and tizanidine per orders. OTC medications and supportive measures reviewed. Recommend avoidance of blue light screens and fluorescent lighting. Quick naps, a few times per day for the rest of the week. Strict in-person precautions discussed.   Follow Up Instructions: I discussed the assessment and treatment plan with the patient. The patient was provided an opportunity to ask questions and all were answered. The patient agreed with the plan and demonstrated an understanding of the instructions.  A copy of instructions were sent to the patient via MyChart unless otherwise noted below.   The patient was advised to call back or seek an in-person evaluation if the symptoms worsen or if the condition fails to improve as anticipated.  Time:  I spent 10 minutes with the patient via telehealth technology discussing the above problems/concerns.    [name], PA-C

## 2023-03-24 ENCOUNTER — Ambulatory Visit: Payer: 59 | Admitting: Medical

## 2023-04-06 ENCOUNTER — Ambulatory Visit: Payer: 59 | Admitting: Medical

## 2023-04-06 VITALS — BP 122/80 | HR 85 | Wt 285.4 lb

## 2023-04-06 DIAGNOSIS — Z7721 Contact with and (suspected) exposure to potentially hazardous body fluids: Secondary | ICD-10-CM | POA: Diagnosis not present

## 2023-04-06 DIAGNOSIS — H5369 Other night blindness: Secondary | ICD-10-CM

## 2023-04-06 DIAGNOSIS — R7989 Other specified abnormal findings of blood chemistry: Secondary | ICD-10-CM | POA: Diagnosis not present

## 2023-04-06 DIAGNOSIS — E559 Vitamin D deficiency, unspecified: Secondary | ICD-10-CM

## 2023-04-06 DIAGNOSIS — R748 Abnormal levels of other serum enzymes: Secondary | ICD-10-CM

## 2023-04-06 NOTE — Progress Notes (Signed)
Subjective:  Kenneth Kennedy is a 22 y.o. male who presents for Chief Complaint  Patient presents with   Medical Management of Chronic Issues    3 month follow-up on Vitamin D, cholesterol, elevated liver test     Here for 74-month follow-up on issues from last visit.  Back in April he had liver test slightly elevated, alkaline phosphatase elevated, vitamin D was low at 25, triglycerides and LDL cholesterol were too high.  At that time we recommended prescription vitamin D, lifestyle changes and a recheck on labs in 3 months.  Here today for this.  He is compliant with vitamin D 50,000 weekly  He has been working on [employer] and getting some exercise.  No history of hepatitis exposure.  No heavy alcohol use, rare alcohol use.  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  He was seen at a clinic but they did not do any testing.  They thought he was low risk.  He notes some nighttime vision that is not as crisp as it used to be  No other aggravating or relieving factors.    No other c/o.  The following portions of the patient's history were reviewed and updated as appropriate: allergies, current medications, past family history, past medical history, past social history, past surgical history and problem list.  ROS Otherwise as in subjective above  Objective: BP 122/80   Pulse 85   Wt 285 lb 6.4 oz (129.5 kg)   BMI 34.74 kg/m   Wt Readings from Last 3 Encounters:  04/06/23 285 lb 6.4 oz (129.5 kg)  02/11/23 286 lb 6.4 oz (129.9 kg)  12/21/22 287 lb 12.8 oz (130.5 kg)   General appearance: alert, no distress, well developed, well nourished Abdomen: +bs, soft, non tender, non distended, no masses, no hepatomegaly, no splenomegaly Pulses: 2+ radial pulses, 2+ pedal pulses, normal cap refill Ext: no edema   Assessment: Encounter Diagnoses  Name Primary?   Vitamin D deficiency Yes   Elevated LFTs    Elevated alkaline  phosphatase level    Exposure to blood-borne pathogen    Diminished night vision      Plan: Liver test slightly elevated last visit in April-recheck liver panel today  Alkaline phosphatase elevated-prior isoenzymes did not show a specific abnormality back in 2023.  I suspect this is all due to vitamin D deficiency.  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  Diagnoses and all orders for this visit:  Vitamin D deficiency -     VITAMIN D 25 Hydroxy (Vit-D Deficiency, Fractures)  Elevated LFTs -     Hepatic function panel -     Hepatitis C antibody -     Hepatitis B surface antigen -     HIV Antibody (routine testing w rflx)  Elevated alkaline phosphatase level -     Hepatic function panel  Exposure to blood-borne pathogen -  Hepatitis C antibody -     Hepatitis B surface antigen -     HIV Antibody (routine testing w rflx)  Diminished night vision   Follow up: pending labs

## 2023-04-07 ENCOUNTER — Other Ambulatory Visit: Payer: Self-pay | Admitting: Medical

## 2023-04-07 LAB — HEPATIC FUNCTION PANEL
ALT: 45 IU/L — ABNORMAL HIGH (ref 0–44)
AST: 23 IU/L (ref 0–40)
Albumin: 4.6 g/dL (ref 4.3–5.2)
Alkaline Phosphatase: 154 IU/L — ABNORMAL HIGH (ref 44–121)
Bilirubin Total: 0.9 mg/dL (ref 0.0–1.2)
Bilirubin, Direct: 0.2 mg/dL (ref 0.00–0.40)
Total Protein: 7 g/dL (ref 6.0–8.5)

## 2023-04-07 LAB — HIV ANTIBODY (ROUTINE TESTING W REFLEX): HIV Screen 4th Generation wRfx: NONREACTIVE

## 2023-04-07 LAB — HEPATITIS C ANTIBODY: Hep C Virus Ab: NONREACTIVE

## 2023-04-07 LAB — VITAMIN D 25 HYDROXY (VIT D DEFICIENCY, FRACTURES): Vit D, 25-Hydroxy: 52.1 ng/mL (ref 30.0–100.0)

## 2023-04-07 LAB — HEPATITIS B SURFACE ANTIGEN: Hepatitis B Surface Ag: NEGATIVE

## 2023-04-07 MED ORDER — VITAMIN D (ERGOCALCIFEROL) 1.25 MG (50000 UNIT) PO CAPS: 50000.0000 [IU] | ORAL_CAPSULE | ORAL | 3 refills | Status: DC

## 2023-04-07 NOTE — Progress Notes (Signed)
Results sent through MyChart

## 2023-12-26 DIAGNOSIS — B070 Plantar wart: Secondary | ICD-10-CM

## 2024-01-05 ENCOUNTER — Encounter: Payer: Self-pay | Admitting: Podiatry

## 2024-01-05 ENCOUNTER — Ambulatory Visit: Admitting: Podiatry

## 2024-01-05 DIAGNOSIS — I7300 Raynaud's syndrome without gangrene: Secondary | ICD-10-CM | POA: Diagnosis not present

## 2024-01-05 DIAGNOSIS — B079 Viral wart, unspecified: Secondary | ICD-10-CM

## 2024-01-05 MED ORDER — FLUOROURACIL 5 % EX CREA: TOPICAL_CREAM | Freq: Two times a day (BID) | CUTANEOUS | 0 refills | Status: DC

## 2024-01-05 NOTE — Progress Notes (Signed)
  Subjective:  Patient ID: Kenneth Kennedy, male    DOB: 2000/12/10,  MRN: [id number] HPI Chief Complaint  Patient presents with   Plantar Warts    RM#8 Warts noticed on bottom of right foot 2 weeks ago no pain.    23 y.o. male presents with the above complaint.   ROS: Denies fever chills nausea vomiting muscle aches pains calf pain back pain chest pain shortness of breath.  Past Medical History:  Diagnosis Date   History of depression 2023   Past Surgical History:  Procedure Laterality Date   TYMPANOSTOMY TUBE PLACEMENT      Current Outpatient Medications:    fluorouracil (EFUDEX) 5 % cream, Apply topically 2 (two) times daily., Disp: 40 g, Rfl: 0   Vitamin D , Ergocalciferol , (DRISDOL ) 1.25 MG (50000 UNIT) CAPS capsule, Take 1 capsule (50,000 Units total) by mouth every 7 (seven) days., Disp: 12 capsule, Rfl: 3  Allergies  Allergen Reactions   Cleocin [Clindamycin] Hives and Nausea And Vomiting   Review of Systems Objective:  There were no vitals filed for this visit.  General: Well developed, nourished, in no acute distress, alert and oriented x3   Dermatological: Skin is warm, dry and supple bilateral. Nails x 10 are well maintained; remaining integument appears unremarkable at this time. There are no open sores, no preulcerative lesions, no rash or signs of infection present.,  Neither verrucoid lesions thrombosed capillaries around the first metatarsal phalangeal joint right foot states that they have been present for many years.  No single component measuring greater than 5 mm in diameter.  Vascular: Dorsalis Pedis artery and Posterior Tibial artery pedal pulses are 2/4 bilateral with immedate capillary fill time. Pedal hair growth present. No varicosities and no lower extremity edema present bilateral.  He does have discoloration of the toes white and red in the feet are cool to the touch there is some mild dyshidrosis as well.  Neruologic: Grossly intact via light touch  bilateral. Vibratory intact via tuning fork bilateral. Protective threshold with Semmes Wienstein monofilament intact to all pedal sites bilateral. Patellar and Achilles deep tendon reflexes 2+ bilateral. No Babinski or clonus noted bilateral.   Musculoskeletal: No gross boney pedal deformities bilateral. No pain, crepitus, or limitation noted with foot and ankle range of motion bilateral. Muscular strength 5/5 in all groups tested bilateral.  Gait: Unassisted, Nonantalgic.    Radiographs:  None taken  Assessment & Plan:   Assessment: Verruca plantaris right foot only.  Raynaud's phenomenon.  Plan: Discussed etiology pathology conservative versus surgical therapies.  At this point I recommended the use of Efudex cream twice daily.  I sent that to his local pharmacy.  Recommend that he follow-up with his primary care provider for calcium channel blockers.     Kenneth Kennedy, [place]

## 2024-03-08 ENCOUNTER — Ambulatory Visit: Admitting: Podiatry

## 2024-03-08 ENCOUNTER — Encounter: Payer: Self-pay | Admitting: Podiatry

## 2024-03-08 DIAGNOSIS — B070 Plantar wart: Secondary | ICD-10-CM

## 2024-03-08 NOTE — Progress Notes (Signed)
 He presents today with his mother for follow-up of his plantar warts on his right foot.  States is not really looking any better primarily because I cannot keep the lotion as he refers the Efudex  cream on them because he sweats so much he says.  He has not noticed any new ones as he refers to the warts plantar aspect of the forefoot right.  Objective: Vitals are stable alert oriented x 3.  Pulses are palpable.  There is no erythema edema salines drainage odor multiple verrucoid lesions from the interphalangeal joint of the hallux proximally to the sesamoidal apparatus area.  There appears to be 1 beneath the second metatarsal head.  Thrombosed capillaries are visible skin lines circumvent the lesion all consistent with wart development.  Assessment: Verruca plantaris intractable to medication.  Plan: Discussed etiology pathology and surgical therapies at this point consented him for laser assisted excision lesions plantar aspect right foot.  We did discuss that the biggest thing here would be risk of infection or reoccurrence he understands and is amenable to it we will do this since her surgeries were near future.

## 2024-03-19 ENCOUNTER — Telehealth: Payer: Self-pay | Admitting: Podiatry

## 2024-03-19 NOTE — Telephone Encounter (Signed)
 DOS- 03/23/2024  DESTRUCTION OF BENIGN SKIN LESIONS RT- 17110  [employer] EFFECTIVE DATE- 08/24/2023  DEDUCTIBLE- $500 REMAINING- $500 FAMILY DEDUCTIBLE- $1500 REMAINING- $1500 OOP- $5000 REMAINING- $4777.27  FAMILY OOP- $15000 REMAINING- $85400.71 COINSURANCE- 20% AFTER DEDUCTIBLE MET  PER [employer] WEBSITE, NO PRIOR AUTH IS REQUIRED FOR CPT CODE 82889.  DECISION ID# [id number] 03/19/2024

## 2024-03-22 ENCOUNTER — Other Ambulatory Visit: Payer: Self-pay | Admitting: Podiatry

## 2024-03-22 MED ORDER — TRAMADOL HCL 50 MG PO TABS: 50.0000 mg | ORAL_TABLET | Freq: Three times a day (TID) | ORAL | 0 refills | Status: AC | PRN

## 2024-03-22 MED ORDER — CEPHALEXIN 500 MG PO CAPS: 500.0000 mg | ORAL_CAPSULE | Freq: Three times a day (TID) | ORAL | 0 refills | Status: DC

## 2024-03-23 DIAGNOSIS — B070 Plantar wart: Secondary | ICD-10-CM | POA: Diagnosis not present

## 2024-03-26 ENCOUNTER — Ambulatory Visit: Admitting: Podiatry

## 2024-03-26 ENCOUNTER — Encounter: Payer: Self-pay | Admitting: Podiatry

## 2024-03-26 VITALS — Ht 76.0 in | Wt 285.4 lb

## 2024-03-26 DIAGNOSIS — B070 Plantar wart: Secondary | ICD-10-CM | POA: Diagnosis not present

## 2024-03-26 NOTE — Progress Notes (Signed)
   Chief Complaint  Patient presents with   Post-op Problem    Pt is here due to right foot he had surgery on 8/1 to have plantar wart lasered, states instruction was to soak foot for 30 minutes afterwards which he did bandage that is on will not come off and when he tried to pull area started bleeding.    Subjective:  Patient presents today status post electric cautery of multiple plantar verruca warts to the right foot.  Performed at the [facility] with Dr. Verta on Friday, 03/23/2024.    Patient states that the dressings are adhered and he cannot remove them.  He was given instructions to remove the dressings and begin soaking.  Past Medical History:  Diagnosis Date   History of depression 2023    Past Surgical History:  Procedure Laterality Date   TYMPANOSTOMY TUBE PLACEMENT      Allergies  Allergen Reactions   Cleocin [Clindamycin] Hives and Nausea And Vomiting    Objective/Physical Exam Neurovascular status intact.  Multiple verruca lesion debridement as noted which appear stable   Assessment: 1. s/p cautery of multiple verruca lesions right foot.. DOS: 03/23/2024.  Dr. Verta   Plan of Care:  -Patient was evaluated. -Dressings were soaked and removed today.  He tolerated this well -Triple antibiotic and light dressing applied -Recommend resuming Epsom salt soaks and triple antibiotic with nonadherent dressings -Continue WBAT surgical shoe -Return to clinic next scheduled appointment with Dr. Verta Thresa EMERSON Janit, DPM [facility]  Dr. Thresa EMERSON Janit, DPM    [address] [place], [place] [address]                Office [phone]  Fax [phone]

## 2024-03-29 ENCOUNTER — Encounter: Payer: Self-pay | Admitting: Podiatry

## 2024-03-29 ENCOUNTER — Ambulatory Visit (INDEPENDENT_AMBULATORY_CARE_PROVIDER_SITE_OTHER): Admitting: Podiatry

## 2024-03-29 DIAGNOSIS — B070 Plantar wart: Secondary | ICD-10-CM

## 2024-03-29 NOTE — Progress Notes (Signed)
 He presents today for his postop visit laser wart removal right foot.  States that they are still light but the big one drained a little bit.  Objective: Vital signs are stable and oriented x 3.  Pulses are palpable.  Laser excision warts are starting to heal very nicely granulation tissue epithelialization is occurring.  No signs of infection.  Assessment: I would say these lesions are healed by about 50% at this point.  Plan: Continue to soak the wounds daily but in Epsom salts and warm water instead of Betadine and warm water cover with a light dressing and Telfa pad.  Follow-up with him in about 2 weeks.

## 2024-04-09 ENCOUNTER — Encounter: Payer: Self-pay | Admitting: Podiatry

## 2024-04-12 ENCOUNTER — Ambulatory Visit: Admitting: Podiatry

## 2024-04-12 ENCOUNTER — Encounter: Payer: Self-pay | Admitting: Podiatry

## 2024-04-12 DIAGNOSIS — B070 Plantar wart: Secondary | ICD-10-CM | POA: Diagnosis not present

## 2024-04-12 NOTE — Progress Notes (Signed)
 He presents today for follow-up of his warts plantarly.  They were lasered off March 23, 2024.  Objective: Vital signs stable orient x 3 needs abdominal to heal very nicely.  I see no recurrence of warty tissue.  Assessment: Well-healing verruca plantaris laser excision.  Plan: Follow-up with me on an as-needed basis.

## 2024-06-12 ENCOUNTER — Ambulatory Visit: Admitting: Podiatry

## 2024-06-12 ENCOUNTER — Encounter: Payer: Self-pay | Admitting: Podiatry

## 2024-06-12 DIAGNOSIS — B070 Plantar wart: Secondary | ICD-10-CM | POA: Diagnosis not present

## 2024-06-13 NOTE — Progress Notes (Signed)
 He presents today chief complaint of pain to the right foot I think that maybe the warts have come back.  He started using his cream again.  Objective: Vital signs stable alert oriented x 3 plantar aspect of the right foot does demonstrate what appears to be verrucoid lesions and scar tissue from previous laser excision.  Thrombosed capillaries are visible skin lines to circumvent the lesion are present.  Assessment: Multiple small warts plantar aspect forefoot right some are indistinguishable from scar tissue from previous laser.  Plan: Chemical destruction with Cantharone was performed today leave mobic tonight wash off thoroughly tomorrow structures were discussed and he will start back on his Efudex  cream twice daily.  I will follow-up with him in about 3 to 4 weeks to decide whether or not we may need to go laser these again while he is on break from school.

## 2024-06-26 ENCOUNTER — Ambulatory Visit: Admitting: Medical

## 2024-06-26 VITALS — BP 122/80 | HR 94 | Wt 322.0 lb

## 2024-06-26 DIAGNOSIS — L729 Follicular cyst of the skin and subcutaneous tissue, unspecified: Secondary | ICD-10-CM

## 2024-06-26 NOTE — Telephone Encounter (Signed)
 Appt made for today

## 2024-06-26 NOTE — Progress Notes (Signed)
 Subjective:  Kenneth Kennedy is a 23 y.o. male who presents for Chief Complaint  Patient presents with   Acute Visit    Cyst under skin, discomfort when pressing on it x 1 week     Kenneth Kennedy is a 23 year old male who presents with a palpable bump.  He has a palpable bump that is painful upon pressure of right chest wall/ribs. The bump is not visible, and he has not experienced any similar bumps in the past. There is no redness or angry appearance associated with the bump.  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 He has a history of plantar warts that have recurred after previous treatment. He may have to have another surgery in December for these, sees podiatry  No other bumps, redness, or angry appearance of the current bump. No systemic symptoms such as fevers, night sweats, or loss of appetite.  No other aggravating or relieving factors.    No other c/o.  The following portions of the patient's history were reviewed and updated as appropriate: allergies, current medications, past family history, past medical history, past social history, past surgical history and problem list.  ROS Otherwise as in subjective above  Objective: BP 122/80   Pulse 94   Wt (!) 322 lb (146.1 kg)   BMI 39.20 kg/m   General appearance: alert, no distress, well developed, well nourished Skin: Right lower chest wall with a small round somewhat mobile well circumscribed cystic lesion that is tender to palpation but otherwise nontender with no surrounding erythema fluctuance or warmth.   Assessment: Encounter Diagnosis  Name Primary?   Cyst of skin Yes     Plan: We discussed skin lesions, cyst versus lipoma versus other.  This seems to be a benign cyst.  Reassured.  Advised to leave alone.  If any changes going forward or other new symptoms then call or recheck.  Kenneth Kennedy was seen today for acute visit.  Diagnoses and all orders for this visit:  Cyst of  skin    Follow up: prn

## 2024-07-10 ENCOUNTER — Ambulatory Visit: Admitting: Podiatry

## 2024-07-10 ENCOUNTER — Encounter: Payer: Self-pay | Admitting: Podiatry

## 2024-07-10 DIAGNOSIS — B070 Plantar wart: Secondary | ICD-10-CM

## 2024-07-10 NOTE — Progress Notes (Signed)
 He presents today for follow-up and once he has for warts on the plantar aspect of the right foot that appear to be healing very nicely at this point continues his Efudex  cream daily.  Objective: Vital signs are stable he is alert oriented x 3.  Warts almost linearly along the plantar medial aspect of the first metatarsophalangeal joint and the hallux do demonstrate healing properties they are much smaller than they were previously.  Assessment: Verruca plantaris.  Plan: Continue Efudex  cream we did place some Cantharone under occlusion today.  Follow-up with him in 2 months

## 2024-07-26 ENCOUNTER — Encounter: Payer: Self-pay | Admitting: Medical

## 2024-07-26 ENCOUNTER — Ambulatory Visit: Admitting: Medical

## 2024-07-26 VITALS — BP 118/80 | HR 74 | Ht 76.0 in | Wt 322.2 lb

## 2024-07-26 DIAGNOSIS — R635 Abnormal weight gain: Secondary | ICD-10-CM

## 2024-07-26 DIAGNOSIS — Z0000 Encounter for general adult medical examination without abnormal findings: Secondary | ICD-10-CM | POA: Diagnosis not present

## 2024-07-26 DIAGNOSIS — E739 Lactose intolerance, unspecified: Secondary | ICD-10-CM

## 2024-07-26 DIAGNOSIS — Z13220 Encounter for screening for lipoid disorders: Secondary | ICD-10-CM | POA: Diagnosis not present

## 2024-07-26 DIAGNOSIS — Z6839 Body mass index (BMI) 39.0-39.9, adult: Secondary | ICD-10-CM | POA: Diagnosis not present

## 2024-07-26 DIAGNOSIS — R5383 Other fatigue: Secondary | ICD-10-CM

## 2024-07-26 DIAGNOSIS — Z131 Encounter for screening for diabetes mellitus: Secondary | ICD-10-CM

## 2024-07-26 DIAGNOSIS — Z136 Encounter for screening for cardiovascular disorders: Secondary | ICD-10-CM

## 2024-07-26 LAB — LIPID PANEL

## 2024-07-26 NOTE — Progress Notes (Signed)
 Subjective:  Kenneth Kennedy is a 23 y.o. male who presents for Chief Complaint  Patient presents with   Acute Visit    Weight gain since August.    Annual Exam     Kenneth Kennedy is a 23 year old male who presents for well visit with significant weight gain over the past four months.  He has gained 40 pounds over the past four months, with daily weight fluctuations of 4 to 5 pounds. His current weight is 316 pounds, down from 321 pounds yesterday, with measurements taken at the same time each day. He engages in physical activity by going to the gym three to four times a week, focusing on resistance training and some cardio, and walks his dogs daily for about three-quarters of a mile. He estimates his daily calorie intake to be between 2,800 to 3,000 calories, having previously tracked it using an app. He primarily drinks water and has switched from regular soda to zero-calorie versions occasionally.  He experiences 'brain fog' in the middle of the day over the past week. He reports he has not had a physical examination this year.  He has a history of depression and is allergic to Cleocin. He has undergone ear tube surgery and laser surgery for plantar issues in the past few months. He suspects lactose intolerance, experiencing bloating and discomfort with dairy products.  No current concern for depression and last medication > 1 year ago  He does not smoke, consume alcohol, or use drugs. He lives at home and is currently attending [employer]. There is no known family history of diabetes or thyroid problems.  No other aggravating or relieving factors.    No other c/o.  Allergies  Allergen Reactions   Cleocin [Clindamycin] Hives and Nausea And Vomiting    Past Medical History:  Diagnosis Date   History of depression 2023    No current outpatient medications on file prior to visit.   No current facility-administered medications on file prior to visit.     No current outpatient  medications on file.  Family History  Problem Relation Age of Onset   Depression Mother    Anxiety disorder Mother    Cancer Father        melanoma   Hydrocephalus Brother    Diabetes Other    Stroke Other    Heart disease Other     Past Surgical History:  Procedure Laterality Date   LASER ABLATION     for plantar warts   TYMPANOSTOMY TUBE PLACEMENT      The following portions of the patient's history were reviewed and updated as appropriate: allergies, current medications, past family history, past medical history, past social history, past surgical history and problem list.  ROS Otherwise as in subjective above    Objective: BP 118/80   Pulse 74   Ht 6' 4 (1.93 m)   Wt (!) 322 lb 3.2 oz (146.1 kg)   BMI 39.22 kg/m   Wt Readings from Last 3 Encounters:  07/26/24 (!) 322 lb 3.2 oz (146.1 kg)  06/26/24 (!) 322 lb (146.1 kg)  03/26/24 285 lb 6.4 oz (129.5 kg)    General appearance: alert, no distress, well developed, well nourished HEENT: normocephalic, sclerae anicteric, conjunctiva pink and moist, TMs pearly, nares patent, no discharge or erythema, pharynx normal Oral cavity: MMM, no lesions Neck: supple, no lymphadenopathy, no thyromegaly, no masses Heart: RRR, normal S1, S2, no murmurs Lungs: CTA bilaterally, no wheezes, rhonchi, or rales MSK:  normal ROM, unremarkable Back: nontender, normal ROM Abdomen: +bs, soft, non tender, non distended, no masses, no hepatomegaly, no splenomegaly Pulses: 2+ radial pulses, 2+ pedal pulses, normal cap refill Ext: no edema GU: normal male, circ, no hernia or lymphadenopathy Skin: right cheek with 2mm diameter papular lesion with crust from recent razor scraping while shaving    Assessment: Encounter Diagnoses  Name Primary?   Encounter for health maintenance examination in adult Yes   Encounter for lipid screening for cardiovascular disease    BMI 39.0-39.9,adult    Weight gain    Lactose intolerance    Screening  for diabetes mellitus    Other fatigue      Plan: Adult Wellness Visit Routine wellness visit focused on lifestyle modifications for weight management and health. - Ordered routine labs: blood count, liver, kidney, electrolyte, cholesterol, thyroid, hemoglobin A1c, B12. - Encouraged dental check-ups every six months. - Advised regular exercise: cardio and resistance training. - Discussed balanced diet: high protein, reduced carbohydrates. - Recommended dietary tracking app like My Fitness Pal.  Obesity with abnormal weight gain Significant weight gain of 40 pounds over four months. BMI near 40. Differential includes thyroid dysfunction and diabetes. Discussed weight loss medications if lifestyle changes fail, considering insurance and cost. - Ordered thyroid function tests and hemoglobin A1c. - Encouraged cardio exercise 4-5 days/week, moderate intensity 45-60 minutes. - Advised resistance training 2-3 days/week. - Recommended dietary changes: eliminate grains for 30 days, high protein (80-100g/day), reduce carbohydrates. - Discussed potential nutritionist referral. - Consider weight loss medications if lifestyle changes insufficient and covered by insurance.  Fatigue Reports of brain fog and fatigue, possibly linked to weight gain and thyroid dysfunction. - Ordered thyroid function tests. - Recommended vitamin B complex and multivitamin supplementation.  Lactose intolerance Suspected lactose intolerance with bloating and discomfort post-dairy consumption. - Advised avoidance of symptom-causing dairy products. - Recommended alternative calcium sources like vegetables.   Kenneth Kennedy was seen today for acute visit and annual exam.  Diagnoses and all orders for this visit:  Encounter for health maintenance examination in adult -     CBC -     Comprehensive metabolic panel with GFR -     Lipid panel -     Hemoglobin A1c -     TSH + free T4 -     Vitamin B12  Encounter for lipid  screening for cardiovascular disease -     Lipid panel  BMI 39.0-39.9,adult -     Hemoglobin A1c  Weight gain -     Hemoglobin A1c -     TSH + free T4 -     Vitamin B12  Lactose intolerance  Screening for diabetes mellitus -     Hemoglobin A1c  Other fatigue -     TSH + free T4 -     Vitamin B12    Follow up: pending labs

## 2024-07-27 ENCOUNTER — Ambulatory Visit: Payer: Self-pay | Admitting: Medical

## 2024-07-27 DIAGNOSIS — R635 Abnormal weight gain: Secondary | ICD-10-CM

## 2024-07-27 LAB — COMPREHENSIVE METABOLIC PANEL WITH GFR
ALT: 54 IU/L — AB (ref 0–44)
AST: 27 IU/L (ref 0–40)
Albumin: 4.6 g/dL (ref 4.3–5.2)
Alkaline Phosphatase: 148 IU/L — AB (ref 47–123)
BUN/Creatinine Ratio: 11 (ref 9–20)
BUN: 14 mg/dL (ref 6–20)
Bilirubin Total: 1 mg/dL (ref 0.0–1.2)
CO2: 23 mmol/L (ref 20–29)
Calcium: 9.6 mg/dL (ref 8.7–10.2)
Chloride: 100 mmol/L (ref 96–106)
Creatinine, Ser: 1.27 mg/dL (ref 0.76–1.27)
Globulin, Total: 2.8 g/dL (ref 1.5–4.5)
Glucose: 87 mg/dL (ref 70–99)
Potassium: 4.5 mmol/L (ref 3.5–5.2)
Sodium: 138 mmol/L (ref 134–144)
Total Protein: 7.4 g/dL (ref 6.0–8.5)
eGFR: 81 mL/min/1.73 (ref >59)

## 2024-07-27 LAB — LIPID PANEL
Cholesterol, Total: 201 mg/dL — AB (ref 100–199)
HDL: 40 mg/dL (ref >39)
LDL CALC COMMENT:: 5 ratio (ref 0.0–5.0)
LDL Chol Calc (NIH): 143 mg/dL — AB (ref 0–99)
Triglycerides: 100 mg/dL (ref 0–149)
VLDL Cholesterol Cal: 18 mg/dL (ref 5–40)

## 2024-07-27 LAB — TSH+FREE T4
Free T4: 1.21 ng/dL (ref 0.82–1.77)
TSH: 0.862 u[IU]/mL (ref 0.450–4.500)

## 2024-07-27 LAB — CBC
Hematocrit: 50.1 % (ref 37.5–51.0)
Hemoglobin: 16.6 g/dL (ref 13.0–17.7)
MCH: 29.8 pg (ref 26.6–33.0)
MCHC: 33.1 g/dL (ref 31.5–35.7)
MCV: 90 fL (ref 79–97)
Platelets: 297 x10E3/uL (ref 150–450)
RBC: 5.57 x10E6/uL (ref 4.14–5.80)
RDW: 13 % (ref 11.6–15.4)
WBC: 7.1 x10E3/uL (ref 3.4–10.8)

## 2024-07-27 LAB — HEMOGLOBIN A1C
Est. average glucose Bld gHb Est-mCnc: 105 mg/dL
Hgb A1c MFr Bld: 5.3 % (ref 4.8–5.6)

## 2024-07-27 LAB — VITAMIN B12: Vitamin B-12: 553 pg/mL (ref 232–1245)

## 2024-07-27 NOTE — Progress Notes (Signed)
 Have lab add on iron level due to elevated liver test  Refer to nutritionist for obesity and elevated liver test  Results through MyChart

## 2024-07-30 ENCOUNTER — Other Ambulatory Visit: Payer: Self-pay | Admitting: Medical

## 2024-07-30 ENCOUNTER — Encounter: Admitting: Dietician

## 2024-07-30 DIAGNOSIS — R7989 Other specified abnormal findings of blood chemistry: Secondary | ICD-10-CM

## 2024-07-30 NOTE — Progress Notes (Signed)
 Results through MyChart

## 2024-07-31 ENCOUNTER — Other Ambulatory Visit: Payer: Self-pay | Admitting: Medical

## 2024-07-31 ENCOUNTER — Encounter: Payer: Self-pay | Admitting: Internal Medicine

## 2024-07-31 DIAGNOSIS — Z8349 Family history of other endocrine, nutritional and metabolic diseases: Secondary | ICD-10-CM | POA: Insufficient documentation

## 2024-07-31 DIAGNOSIS — R7989 Other specified abnormal findings of blood chemistry: Secondary | ICD-10-CM

## 2024-08-04 LAB — TRANSFERRIN

## 2024-08-04 LAB — FERRITIN

## 2024-08-04 LAB — SPECIMEN STATUS REPORT

## 2024-08-06 NOTE — Progress Notes (Signed)
 Please have him come back in for the additional test as there was enough specimen to run ferritin and transferrin.

## 2024-08-07 ENCOUNTER — Other Ambulatory Visit

## 2024-08-07 DIAGNOSIS — R7989 Other specified abnormal findings of blood chemistry: Secondary | ICD-10-CM

## 2024-08-08 ENCOUNTER — Ambulatory Visit: Payer: Self-pay | Admitting: Medical

## 2024-08-08 ENCOUNTER — Inpatient Hospital Stay: Admission: RE | Admit: 2024-08-08 | Discharge: 2024-08-08 | Attending: Medical | Admitting: Medical

## 2024-08-08 DIAGNOSIS — R7989 Other specified abnormal findings of blood chemistry: Secondary | ICD-10-CM

## 2024-08-08 LAB — TRANSFERRIN: Transferrin: 210 mg/dL (ref 177–329)

## 2024-08-08 LAB — FERRITIN: Ferritin: 402 ng/mL — ABNORMAL HIGH (ref 30–400)

## 2024-08-08 NOTE — Progress Notes (Signed)
 Results through MyChart

## 2024-08-09 ENCOUNTER — Ambulatory Visit: Payer: Self-pay | Admitting: Medical

## 2024-08-09 NOTE — Progress Notes (Signed)
 Results through MyChart

## 2024-08-18 LAB — IRON: Iron: 162 ug/dL (ref 38–169)

## 2024-08-18 LAB — SPECIMEN STATUS REPORT

## 2024-08-20 ENCOUNTER — Encounter: Admitting: Dietician

## 2024-08-20 ENCOUNTER — Encounter: Payer: Self-pay | Admitting: Dietician

## 2024-08-20 VITALS — Ht 76.0 in | Wt 322.0 lb

## 2024-08-20 DIAGNOSIS — R635 Abnormal weight gain: Secondary | ICD-10-CM | POA: Diagnosis present

## 2024-08-20 NOTE — Progress Notes (Signed)
 Medical Nutrition Therapy  Appointment Start time:  1400  Appointment End time:  1515  Primary concerns today: Weight change,   Referral diagnosis: R63.5 (ICD-10-CM) - Weight gain Preferred learning style: No preference indicated Learning readiness: Change in progress   NUTRITION ASSESSMENT   Anthropometrics: Ht: 76 Wt: 322 lbs BMI: 39.20 kg/m2 Wt Hx: Gain ~40 lbs over last 6 months   Clinical Medical Hx: Elevated LFTs, Obesity Medications: N/A Labs: Alk Phos - 148, ALT - 54, Cholesterol - 201, LDL - 143 Notable Signs/Symptoms: N/A   Lifestyle & Dietary Hx Pt reports unintentional weight gain over the summer, states they had plantar wart removed and had to be off of their feet for a good month, states they ate more fast food during this time as well. Pt states they have been IF for the last month, not eating until noon to 6:00 or 7:00 PM. Pt reports switching from regular soda to ZERO sugar sodas, previously would have 1-3 sodas a day. 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 Pt reports high stress associated with school work and pressure to get good grades. Pt reports living at home with parents, states mom does most of the cooking but they will occasionally cook and enjoys cooking when they can. Pt reports they are now resistance training 3-4 days a week in the mornings 60-120 minutes, occasionally plays hockey for an hour on the weekend.   Estimated daily fluid intake: >96 oz Supplements: N/A Sleep: Occasionally poor, difficulty falling asleep/staying asleep Stress / self-care: In school (8/10), out of school (3/10) Current average weekly physical activity: 3-4x a week for 60 minutes    24-Hr Dietary Recall First Meal: 2 slices frozen meat lover's pizza Snack:  Second Meal: 2 Steak tacos w/ avocado, extra scoop of meat Snack:  Third Meal:  Snack:  Beverages: Zero sugar/caffeine Coke,  Water, White Cran/Strawberry juice   NUTRITION DIAGNOSIS  [place]-2.2 Altered nutrition-related laboratory As related to Elevated LFTs.  As evidenced by ALT of 54, Alk Phos of 148, previous diet history high in packaged/processed foods, saturated fat, concentrated sugars.   NUTRITION INTERVENTION  Nutrition education (E-1) on the following topics:  Educated patient on the two components of energy balance: Energy in (calories), and energy out (activity). Explain the role of negative energy balance in weight loss. Discussed options with patient to achieve a negative energy balance and how to best control energy in and energy out to accommodate their lifestyle. Educate pt on factors that can elevate LDL cholesterol, including high dietary intake of saturated fats. Educate pt on identifying sources of saturated fats, and how to make alternative food choices to lower saturated fat intake. Educate pt on the role of soluble fiber in binding to cholesterol in the GI tract an eliminating it from the body. Educate pt on dietary sources of soluble fiber.   Handouts Provided Include  Eating Before Exercise Eating for Recovery  Learning Style & Readiness for Change Teaching method utilized: Visual & Auditory  Demonstrated degree of understanding via: Teach Back  Barriers to learning/adherence to lifestyle change: None  Goals Established by Pt Be sure to have 1-2 servings of carbs prior to working out and have your protein shake within 30 minutes of finishing your workout. On your off days, have your pre and post-workout snacks together for a balanced breakfast, Keep up the great work exercising and doing resistance exercises! With your packaged,  processed, and fast foods, choose lower fat options and smaller portions. Add in non-starchy vegetables of any kind as much as you like Choose LEAN proteins as often as you can! Keep a log of your food choices moving forward and be sure to note any intentional  changes you make towards achieving your goals!   MONITORING & EVALUATION Dietary intake, weekly physical activity, labs, and weight change PRN.  Next Steps  Patient is to follow up with RD after labs.

## 2024-08-20 NOTE — Patient Instructions (Addendum)
 Be sure to have 1-2 servings of carbs prior to working out and have your protein shake within 30 minutes of finishing your workout. On your off days, have your pre and post-workout snacks together for a balanced breakfast,  Keep up the great work exercising and doing resistance exercises!  With your packaged, processed, and fast foods, choose lower fat options and smaller portions. Add in non-starchy vegetables of any kind as much as you like  Choose LEAN proteins as often as you can!  Keep a log of your food choices moving forward and be sure to note any intentional changes you make towards achieving your goals!

## 2024-09-04 ENCOUNTER — Ambulatory Visit: Admitting: Podiatry

## 2024-09-13 ENCOUNTER — Ambulatory Visit: Admitting: Podiatry

## 2024-09-28 ENCOUNTER — Encounter: Payer: Self-pay | Admitting: Medical

## 2025-07-26 ENCOUNTER — Encounter: Admitting: Medical
